# Patient Record
Sex: Male | Born: 1985 | Race: Black or African American | Hispanic: No | Marital: Single | Smoking: Never smoker
Health system: Southern US, Community
[De-identification: ages and names within clinical notes are randomized; demographics above are authoritative.]

## PROBLEM LIST (undated history)

## (undated) DIAGNOSIS — F909 Attention-deficit hyperactivity disorder, unspecified type: Secondary | ICD-10-CM

## (undated) DIAGNOSIS — F419 Anxiety disorder, unspecified: Secondary | ICD-10-CM

## (undated) DIAGNOSIS — I10 Essential (primary) hypertension: Secondary | ICD-10-CM

## (undated) DIAGNOSIS — F32A Depression, unspecified: Secondary | ICD-10-CM

## (undated) HISTORY — DX: Depression, unspecified: F32.A

## (undated) HISTORY — PX: TOOTH EXTRACTION: SUR596

## (undated) HISTORY — DX: Anxiety disorder, unspecified: F41.9

## (undated) HISTORY — DX: Essential (primary) hypertension: I10

## (undated) HISTORY — DX: Attention-deficit hyperactivity disorder, unspecified type: F90.9

---

## 2005-07-01 ENCOUNTER — Emergency Department: Payer: Self-pay | Admitting: Emergency Medicine

## 2005-08-11 ENCOUNTER — Emergency Department: Payer: Self-pay | Admitting: Unknown Physician Specialty

## 2006-03-12 ENCOUNTER — Emergency Department: Payer: Self-pay | Admitting: Emergency Medicine

## 2007-03-05 ENCOUNTER — Emergency Department: Payer: Self-pay | Admitting: Emergency Medicine

## 2007-06-30 ENCOUNTER — Emergency Department: Payer: Self-pay | Admitting: Emergency Medicine

## 2008-04-28 ENCOUNTER — Emergency Department: Payer: Self-pay | Admitting: Emergency Medicine

## 2008-07-06 ENCOUNTER — Emergency Department: Payer: Self-pay | Admitting: Emergency Medicine

## 2008-07-06 ENCOUNTER — Other Ambulatory Visit: Payer: Self-pay

## 2010-09-02 ENCOUNTER — Emergency Department: Payer: Self-pay | Admitting: Emergency Medicine

## 2010-09-03 ENCOUNTER — Emergency Department: Payer: Self-pay | Admitting: Emergency Medicine

## 2014-11-05 ENCOUNTER — Emergency Department: Payer: Self-pay | Admitting: Emergency Medicine

## 2015-05-22 ENCOUNTER — Encounter: Payer: Self-pay | Admitting: Emergency Medicine

## 2015-05-22 ENCOUNTER — Emergency Department
Admission: EM | Admit: 2015-05-22 | Discharge: 2015-05-22 | Disposition: A | Discharge status: Home / Self Care | Payer: Self-pay | Attending: Emergency Medicine | Admitting: Emergency Medicine

## 2015-05-22 DIAGNOSIS — K529 Noninfective gastroenteritis and colitis, unspecified: Secondary | ICD-10-CM

## 2015-05-22 LAB — CBC
HCT: 47.1 % (ref 40.0–52.0)
Hemoglobin: 15.2 g/dL (ref 13.0–18.0)
MCH: 26.4 pg (ref 26.0–34.0)
MCHC: 32.2 g/dL (ref 32.0–36.0)
MCV: 81.9 fL (ref 80.0–100.0)
PLATELETS: 275 10*3/uL (ref 150–440)
RBC: 5.75 MIL/uL (ref 4.40–5.90)
RDW: 14.8 % — ABNORMAL HIGH (ref 11.5–14.5)
WBC: 13.4 10*3/uL — ABNORMAL HIGH (ref 3.8–10.6)

## 2015-05-22 LAB — COMPREHENSIVE METABOLIC PANEL
ALBUMIN: 4.7 g/dL (ref 3.5–5.0)
ALT: 41 U/L (ref 17–63)
ANION GAP: 10 (ref 5–15)
AST: 36 U/L (ref 15–41)
Alkaline Phosphatase: 59 U/L (ref 38–126)
BUN: 13 mg/dL (ref 6–20)
CO2: 26 mmol/L (ref 22–32)
Calcium: 9.4 mg/dL (ref 8.9–10.3)
Chloride: 101 mmol/L (ref 101–111)
Creatinine, Ser: 0.83 mg/dL (ref 0.61–1.24)
GFR calc Af Amer: >60 mL/min (ref >60)
GFR calc non Af Amer: >60 mL/min (ref >60)
GLUCOSE: 125 mg/dL — AB (ref 65–99)
Potassium: 4.5 mmol/L (ref 3.5–5.1)
Sodium: 137 mmol/L (ref 135–145)
TOTAL PROTEIN: 8.7 g/dL — AB (ref 6.5–8.1)
Total Bilirubin: 1 mg/dL (ref 0.3–1.2)

## 2015-05-22 MED ORDER — ONDANSETRON 4 MG PO TBDP
4.0000 mg | ORAL_TABLET | Freq: Once | ORAL | Status: AC
  Administered 2015-05-22: 4 mg via ORAL
  Filled 2015-05-22: qty 1

## 2015-05-22 MED ORDER — ONDANSETRON 4 MG PO TBDP: 4.0000 mg | ORAL_TABLET | Freq: Three times a day (TID) | ORAL | Status: DC | PRN

## 2015-05-22 MED ORDER — ONDANSETRON HCL 4 MG/2ML IJ SOLN
INTRAMUSCULAR | Status: AC
  Filled 2015-05-22: qty 2

## 2015-05-22 MED ORDER — ONDANSETRON HCL 4 MG/2ML IJ SOLN
4.0000 mg | Freq: Once | INTRAMUSCULAR | Status: AC | PRN
  Administered 2015-05-22: 4 mg via INTRAVENOUS

## 2015-05-22 MED ORDER — ONDANSETRON HCL 4 MG/2ML IJ SOLN: 4.0000 mg | Freq: Once | INTRAMUSCULAR | Status: DC

## 2015-05-22 MED ORDER — SODIUM CHLORIDE 0.9 % IV BOLUS (SEPSIS)
1000.0000 mL | Freq: Once | INTRAVENOUS | Status: AC
  Administered 2015-05-22: 1000 mL via INTRAVENOUS

## 2015-05-22 MED ORDER — LOPERAMIDE HCL 2 MG PO CAPS
4.0000 mg | ORAL_CAPSULE | Freq: Once | ORAL | Status: AC
  Administered 2015-05-22: 4 mg via ORAL
  Filled 2015-05-22: qty 2

## 2015-05-22 NOTE — ED Notes (Signed)
Pt sitting up old side of stretcher in exam room with no distress noted; pt reports N/V/D since eating at [facility] last night; currently pt st feeling much better after IVFs received; reports only back pain at present; +BS, abd soft/nondist/nontender; taking sips of sprite at request (MD aware)

## 2015-05-22 NOTE — ED Notes (Signed)
Pt presents to ED with frequent vomiting, headache, and diarrhea X2 since last night after eating at [facility]. Pt states that even the spit in his mouth makes him vomit. Denies abd pain. Pt states he feels completely dehydrated. Pt currently alert and able to answer questions without diffculty. No increased work of breathing or acute distress noted at this time.

## 2015-05-22 NOTE — ED Provider Notes (Signed)
[facility] Emergency Department Provider Note  ____________________________________________  Time seen: 6:30 AM  I have reviewed the triage vital signs and the nursing notes.   HISTORY  Chief Complaint Emesis and Headache      HPI Ryan Morrow is a 29 y.o. male presents with acute onset of vomiting and diarrhea 5:30 AM this morning. Patient admits to multiple episodes of nonbloody vomiting and diarrhea. Patient states he feels very dehydrated. Of note patient admits to sick contact with the same (53-year-old daughter). Patient's last meal was when these last night. Patient denies any abdominal pain     Past medical history None There are no active problems to display for this patient.   Past surgical history None No current outpatient prescriptions on file.  Allergies Review of patient's allergies indicates no known allergies.  No family history on file.  Social History History  Substance Use Topics  . Smoking status: Never Smoker   . Smokeless tobacco: Never Used  . Alcohol Use: No    Review of Systems  Constitutional: Negative for fever. Eyes: Negative for visual changes. ENT: Negative for sore throat. Cardiovascular: Negative for chest pain. Respiratory: Negative for shortness of breath. Gastrointestinal: Positive for vomiting and diarrhea Genitourinary: Negative for dysuria. Musculoskeletal: Negative for back pain. Skin: Negative for rash. Neurological: Negative for headaches, focal weakness or numbness.   10-point ROS otherwise negative.  ____________________________________________   PHYSICAL EXAM:  VITAL SIGNS: ED Triage Vitals  Enc Vitals Group     BP 05/22/15 0240 146/98 mmHg     Pulse Rate 05/22/15 0240 86     Resp 05/22/15 0240 18     Temp 05/22/15 0240 99.1 F (37.3 C)     Temp Source 05/22/15 0240 Oral     SpO2 05/22/15 0240 96 %     Weight 05/22/15 0240 260 lb (117.935 kg)     Height 05/22/15 0240 6'  (1.829 m)     Head Cir --      Peak Flow --      Pain Score 05/22/15 0241 5     Pain Loc --      Pain Edu? --      Excl. in GC? --      Constitutional: Alert and oriented. Well appearing and in no distress. Eyes: Conjunctivae are normal. PERRL. Normal extraocular movements. ENT   Head: Normocephalic and atraumatic.   Nose: No congestion/rhinnorhea.   Mouth/Throat: Mucous membranes are moist.   Neck: No stridor. Hematological/Lymphatic/Immunilogical: No cervical lymphadenopathy. Cardiovascular: Normal rate, regular rhythm. Normal and symmetric distal pulses are present in all extremities. No murmurs, rubs, or gallops. Respiratory: Normal respiratory effort without tachypnea nor retractions. Breath sounds are clear and equal bilaterally. No wheezes/rales/rhonchi. Gastrointestinal: Soft and nontender. No distention. There is no CVA tenderness. Genitourinary: deferred Musculoskeletal: Nontender with normal range of motion in all extremities. No joint effusions.  No lower extremity tenderness nor edema. Neurologic:  Normal speech and language. No gross focal neurologic deficits are appreciated. Speech is normal.  Skin:  Skin is warm, dry and intact. No rash noted. Psychiatric: Mood and affect are normal. Speech and behavior are normal. Patient exhibits appropriate insight and judgment.  ____________________________________________    LABS (pertinent positives/negatives)  Labs Reviewed  COMPREHENSIVE METABOLIC PANEL - Abnormal; Notable for the following:    Glucose, Bld 125 (*)    Total Protein 8.7 (*)    All other components within normal limits  CBC - Abnormal; Notable for the following:  WBC 13.4 (*)    RDW 14.8 (*)    All other components within normal limits        INITIAL IMPRESSION / ASSESSMENT AND PLAN / ED COURSE  Pertinent labs & imaging results that were available during my care of the patient were reviewed by me and considered in my medical  decision making (see chart for details).  Given absence of abdominal pain on exam, history and physical exam consistent with acute gastroenteritis. Patient received Zofran and Imodium 4 mg each respectively area IV normal saline 1 L.  ____________________________________________   FINAL CLINICAL IMPRESSION(S) / ED DIAGNOSES  Final diagnoses:  Acute gastroenteritis      Darci Current, MD 05/23/15 [phone]

## 2015-05-22 NOTE — Discharge Instructions (Signed)

## 2015-11-05 ENCOUNTER — Emergency Department: Payer: Self-pay

## 2015-11-05 ENCOUNTER — Encounter: Payer: Self-pay | Admitting: Emergency Medicine

## 2015-11-05 ENCOUNTER — Emergency Department
Admission: EM | Admit: 2015-11-05 | Discharge: 2015-11-05 | Disposition: A | Discharge status: Home / Self Care | Payer: Self-pay | Attending: Emergency Medicine | Admitting: Emergency Medicine

## 2015-11-05 DIAGNOSIS — Y9389 Activity, other specified: Secondary | ICD-10-CM | POA: Insufficient documentation

## 2015-11-05 DIAGNOSIS — S39012A Strain of muscle, fascia and tendon of lower back, initial encounter: Secondary | ICD-10-CM | POA: Insufficient documentation

## 2015-11-05 DIAGNOSIS — Y92410 Unspecified street and highway as the place of occurrence of the external cause: Secondary | ICD-10-CM | POA: Insufficient documentation

## 2015-11-05 DIAGNOSIS — Y998 Other external cause status: Secondary | ICD-10-CM | POA: Insufficient documentation

## 2015-11-05 MED ORDER — NAPROXEN 500 MG PO TABS: 500.0000 mg | ORAL_TABLET | Freq: Two times a day (BID) | ORAL | Status: DC

## 2015-11-05 MED ORDER — CYCLOBENZAPRINE HCL 10 MG PO TABS
10.0000 mg | ORAL_TABLET | Freq: Every day | ORAL | Status: DC
Start: 2015-11-05 — End: 2016-09-27
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
## 2016-09-27 ENCOUNTER — Emergency Department
Admission: EM | Admit: 2016-09-27 | Discharge: 2016-09-27 | Disposition: A | Discharge status: Home / Self Care | Payer: Self-pay | Attending: Emergency Medicine | Admitting: Emergency Medicine

## 2016-09-27 DIAGNOSIS — R51 Headache: Secondary | ICD-10-CM | POA: Insufficient documentation

## 2016-09-27 DIAGNOSIS — R112 Nausea with vomiting, unspecified: Secondary | ICD-10-CM

## 2016-09-27 DIAGNOSIS — R197 Diarrhea, unspecified: Secondary | ICD-10-CM | POA: Insufficient documentation

## 2016-09-27 DIAGNOSIS — R1013 Epigastric pain: Secondary | ICD-10-CM | POA: Insufficient documentation

## 2016-09-27 LAB — COMPREHENSIVE METABOLIC PANEL
ALT: 29 U/L (ref 17–63)
AST: 22 U/L (ref 15–41)
Albumin: 4.5 g/dL (ref 3.5–5.0)
Alkaline Phosphatase: 59 U/L (ref 38–126)
Anion gap: 8 (ref 5–15)
BUN: 17 mg/dL (ref 6–20)
CHLORIDE: 101 mmol/L (ref 101–111)
CO2: 28 mmol/L (ref 22–32)
CREATININE: 1.51 mg/dL — AB (ref 0.61–1.24)
Calcium: 9.6 mg/dL (ref 8.9–10.3)
GFR calc Af Amer: >60 mL/min (ref >60)
Glucose, Bld: 99 mg/dL (ref 65–99)
Potassium: 3.7 mmol/L (ref 3.5–5.1)
Sodium: 137 mmol/L (ref 135–145)
Total Bilirubin: 1 mg/dL (ref 0.3–1.2)
Total Protein: 8.5 g/dL — ABNORMAL HIGH (ref 6.5–8.1)

## 2016-09-27 LAB — CBC WITH DIFFERENTIAL/PLATELET
Basophils Absolute: 0 10*3/uL (ref 0–0.1)
Basophils Relative: 0 %
EOS ABS: 0.2 10*3/uL (ref 0–0.7)
Eosinophils Relative: 2 %
HCT: 50 % (ref 40.0–52.0)
Hemoglobin: 16.8 g/dL (ref 13.0–18.0)
Lymphocytes Relative: 14 %
Lymphs Abs: 1.1 10*3/uL (ref 1.0–3.6)
MCH: 27.4 pg (ref 26.0–34.0)
MCHC: 33.5 g/dL (ref 32.0–36.0)
MCV: 81.6 fL (ref 80.0–100.0)
MONO ABS: 0.8 10*3/uL (ref 0.2–1.0)
Monocytes Relative: 10 %
Neutro Abs: 5.9 10*3/uL (ref 1.4–6.5)
Neutrophils Relative %: 74 %
Platelets: 261 10*3/uL (ref 150–440)
RBC: 6.13 MIL/uL — ABNORMAL HIGH (ref 4.40–5.90)
RDW: 14.7 % — ABNORMAL HIGH (ref 11.5–14.5)
WBC: 8 10*3/uL (ref 3.8–10.6)

## 2016-09-27 LAB — LIPASE, BLOOD: LIPASE: 17 U/L (ref 11–51)

## 2016-09-27 MED ORDER — ONDANSETRON HCL 4 MG PO TABS: 4.0000 mg | ORAL_TABLET | Freq: Three times a day (TID) | ORAL | 1 refills | Status: DC | PRN

## 2016-09-27 MED ORDER — SODIUM CHLORIDE 0.9 % IV BOLUS (SEPSIS)
1000.0000 mL | Freq: Once | INTRAVENOUS | Status: AC
  Administered 2016-09-27: 1000 mL via INTRAVENOUS

## 2016-09-27 MED ORDER — IBUPROFEN 600 MG PO TABS
600.0000 mg | ORAL_TABLET | Freq: Once | ORAL | Status: AC
  Administered 2016-09-27: 600 mg via ORAL
  Filled 2016-09-27: qty 1

## 2016-09-27 MED ORDER — ONDANSETRON HCL 4 MG/2ML IJ SOLN
4.0000 mg | Freq: Once | INTRAMUSCULAR | Status: AC
  Administered 2016-09-27: 4 mg via INTRAVENOUS
  Filled 2016-09-27: qty 2

## 2016-09-27 NOTE — ED Provider Notes (Signed)
[facility] Emergency Department Provider Note ____________________________________________   I have reviewed the triage vital signs and the triage nursing note.  HISTORY  Chief Complaint Abdominal Pain   Historian Patient  HPI Ryan Morrow is a 30 y.o. male with no significant past medical history, presents with nausea vomiting and diarrhea since Friday evening. He states he had some body aches on Friday and then Saturday had vomiting and diarrhea, nonbloody nonbilious, no bloody stool, but multiple episodes of watery diarrhea. He's had some mild abdominal cramping. This morning he ate an orange, and although he has not thrown up he still feels like it is not digesting.  He feels that he might be dehydrated and states his tongue is very dry. Nodes are moderate.  No significant travel history or sick exposures.    History reviewed. No pertinent past medical history.  There are no active problems to display for this patient.   History reviewed. No pertinent surgical history.  Prior to Admission medications   Medication Sig Start Date End Date Taking? Authorizing Provider  ondansetron (ZOFRAN) 4 MG tablet Take 1 tablet (4 mg total) by mouth every 8 (eight) hours as needed for nausea or vomiting. 09/27/16   Governor Rooksebecca Travon Crochet, MD    No Known Allergies  No family history on file.  Social History Social History  Substance Use Topics  . Smoking status: Never Smoker  . Smokeless tobacco: Never Used  . Alcohol use No    Review of Systems  Constitutional: Negative for fever. Eyes: Negative for visual changes. ENT: Negative for sore throat. Cardiovascular: Negative for chest pain. Respiratory: Negative for shortness of breath. Gastrointestinal: As per history of present illness. Genitourinary: Negative for dysuria. Musculoskeletal: Negative for back pain. Skin: Negative for rash. Neurological: Developed moderate gradual onset global headache this  morning. 10 point Review of Systems otherwise negative ____________________________________________   PHYSICAL EXAM:  VITAL SIGNS: ED Triage Vitals [09/27/16 0738]  Enc Vitals Group     BP 128/76     Pulse Rate 83     Resp 17     Temp 97.4 F (36.3 C)     Temp Source Oral     SpO2 98 %     Weight 240 lb (108.9 kg)     Height 6' (1.829 m)     Head Circumference      Peak Flow      Pain Score 7     Pain Loc      Pain Edu?      Excl. in GC?      Constitutional: Alert and oriented. Well appearing and in no distress. HEENT   Head: Normocephalic and atraumatic.      Eyes: Conjunctivae are normal. PERRL. Normal extraocular movements.      Ears:         Nose: No congestion/rhinnorhea.   Mouth/Throat: Mucous membranes are mildly dry.   Neck: No stridor. Cardiovascular/Chest: Normal rate, regular rhythm.  No murmurs, rubs, or gallops. Respiratory: Normal respiratory effort without tachypnea nor retractions. Breath sounds are clear and equal bilaterally. No wheezes/rales/rhonchi. Gastrointestinal: Soft. No distention, no guarding, no rebound. Very mild discomfort in the epigastrium.  Genitourinary/rectal:Deferred Musculoskeletal: Nontender with normal range of motion in all extremities. No joint effusions.  No lower extremity tenderness.  No edema. Neurologic:  Normal speech and language. No gross or focal neurologic deficits are appreciated. Skin:  Skin is warm, dry and intact. No rash noted. Psychiatric: Mood and affect are normal. Speech  and behavior are normal. Patient exhibits appropriate insight and judgment.   ____________________________________________  LABS (pertinent positives/negatives)  Labs Reviewed  COMPREHENSIVE METABOLIC PANEL - Abnormal; Notable for the following:       Result Value   Creatinine, Ser 1.51 (*)    Total Protein 8.5 (*)    All other components within normal limits  CBC WITH DIFFERENTIAL/PLATELET - Abnormal; Notable for the  following:    RBC 6.13 (*)    RDW 14.7 (*)    All other components within normal limits  LIPASE, BLOOD    ____________________________________________    EKG I, Governor Rooksebecca Valmore Arabie, MD, the attending physician have personally viewed and interpreted all ECGs.  None ____________________________________________  RADIOLOGY All Xrays were viewed by me. Imaging interpreted by Radiologist.  None __________________________________________  PROCEDURES  Procedure(s) performed: None  Critical Care performed: None  ____________________________________________   ED COURSE / ASSESSMENT AND PLAN  Pertinent labs & imaging results that were available during my care of the patient were reviewed by me and considered in my medical decision making (see chart for details).   Ryan Morrow is overall well-appearing, without fever, or vital sign abnormalities, but he does look a little dehydrated on clinical physical exam with a history of fluid losses through vomiting and diarrhea over 24 hours.  No significant abdominal pain. He is having a mild to moderate headache right now which is global in nature and nonfocal without neurologic deficits on exam or by history. I am treating him with IV fluids, Zofran for the nausea and vomiting symptoms, as well as ibuprofen for the headache.  He's not had respiratory symptoms, I think fluids a little bit less likely. I do think clinically this sounds like a viral gastroenteritis.  Improved clinically, labs reassuring as well.  I discussed discharge home with follow up at PCP.    CONSULTATIONS:  None   Patient / Family / Caregiver informed of clinical course, medical decision-making process, and agree with plan.   I discussed return precautions, follow-up instructions, and discharge instructions with patient and/or family.   ___________________________________________   FINAL CLINICAL IMPRESSION(S) / ED DIAGNOSES   Final diagnoses:  Nausea  vomiting and diarrhea              Note: This dictation was prepared with Dragon dictation. Any transcriptional errors that result from this process are unintentional    Governor Rooksebecca Tenelle Andreason, MD 09/27/16 1229

## 2016-09-27 NOTE — ED Triage Notes (Signed)
Pt c/o generalized abd pain with N/V/D since Friday.  

## 2016-09-27 NOTE — Discharge Instructions (Signed)
From Dr. Shaune PollackLord: You were evaluated after nausea vomiting and diarrhea since Friday, and were given IV fluids here in the emergency room. Although no certain cause was found, I suspect a virus, and your exam and evaluation in the emergency department are reassuring today.  You may try over-the-counter Imodium for diarrhea. You're being prescribed Zofran for nausea.  Return to the emergency room for any worsening symptoms including concern for dehydration such as dry mouth and not making urine, fever, no worsening abdominal pain, trouble breathing, confusion or altered mental status, dizziness or passing out.

## 2017-08-29 ENCOUNTER — Emergency Department: Payer: Self-pay

## 2017-08-29 ENCOUNTER — Emergency Department
Admission: EM | Admit: 2017-08-29 | Discharge: 2017-08-29 | Discharge status: Against Medical Advice | Payer: Self-pay | Attending: Emergency Medicine | Admitting: Emergency Medicine

## 2017-08-29 ENCOUNTER — Other Ambulatory Visit: Payer: Self-pay

## 2017-08-29 DIAGNOSIS — R55 Syncope and collapse: Secondary | ICD-10-CM | POA: Insufficient documentation

## 2017-08-29 DIAGNOSIS — Z5329 Procedure and treatment not carried out because of patient's decision for other reasons: Secondary | ICD-10-CM | POA: Insufficient documentation

## 2017-08-29 DIAGNOSIS — F410 Panic disorder [episodic paroxysmal anxiety] without agoraphobia: Secondary | ICD-10-CM | POA: Insufficient documentation

## 2017-08-29 DIAGNOSIS — R079 Chest pain, unspecified: Secondary | ICD-10-CM | POA: Insufficient documentation

## 2017-08-29 LAB — CBC
HCT: 44.9 % (ref 40.0–52.0)
Hemoglobin: 14.6 g/dL (ref 13.0–18.0)
MCH: 27.2 pg (ref 26.0–34.0)
MCHC: 32.5 g/dL (ref 32.0–36.0)
MCV: 83.7 fL (ref 80.0–100.0)
Platelets: 255 10*3/uL (ref 150–440)
RBC: 5.37 MIL/uL (ref 4.40–5.90)
RDW: 14 % (ref 11.5–14.5)
WBC: 8.1 10*3/uL (ref 3.8–10.6)

## 2017-08-29 LAB — BASIC METABOLIC PANEL
Anion gap: 11 (ref 5–15)
BUN: 14 mg/dL (ref 6–20)
CO2: 24 mmol/L (ref 22–32)
Calcium: 9.6 mg/dL (ref 8.9–10.3)
Chloride: 102 mmol/L (ref 101–111)
Creatinine, Ser: 0.89 mg/dL (ref 0.61–1.24)
GFR calc Af Amer: >60 mL/min (ref >60)
GFR calc non Af Amer: >60 mL/min (ref >60)
Glucose, Bld: 102 mg/dL — ABNORMAL HIGH (ref 65–99)
Potassium: 3.5 mmol/L (ref 3.5–5.1)
Sodium: 137 mmol/L (ref 135–145)

## 2017-08-29 LAB — TROPONIN I

## 2017-08-29 MED ORDER — ASPIRIN 81 MG PO CHEW: 324.0000 mg | CHEWABLE_TABLET | Freq: Once | ORAL | Status: DC

## 2017-08-29 NOTE — ED Provider Notes (Signed)
[facility] Emergency Department Provider Note  ____________________________________________  Time seen: Approximately 4:56 PM  I have reviewed the triage vital signs and the nursing notes.   HISTORY  Chief Complaint Chest Pain   HPI Roselind RilyJoshua Hopes is a 31 y.o. male no significant past medical history who presents for evaluation of an episode of chest pain. Patient reports that he was at work when he received a very upsetting text message. He went in the bathroom to wash his face and started hyperventilating. He started to have cramping on both his arms and L sided chest pain that he describes as a sharp cramp. He also noticed tingling and numbness of bilateral hands, fingers and the L side of his face. He had a syncopal episode but did not sustain any injuries.When he regained consciousness he was being lift up by his coworkers. No seizure-like activity, no tongue trauma, no urinary or bowel loss. Patient denies history of anxiety or ever passing out in the past. He is feeling back to his baseline with no further chest pain or cramping. He has family history of ischemic heart disease in his father and grandfather. He is not a smoker. Patient denies any other medical problems.  No past medical history on file.  There are no active problems to display for this patient.   No past surgical history on file.  Prior to Admission medications   Medication Sig Start Date End Date Taking? Authorizing Provider  ondansetron (ZOFRAN) 4 MG tablet Take 1 tablet (4 mg total) by mouth every 8 (eight) hours as needed for nausea or vomiting. 09/27/16   Governor RooksLord, Rebecca, MD    Allergies Patient has no known allergies.  No family history on file.  Social History Social History   Tobacco Use  . Smoking status: Never Smoker  . Smokeless tobacco: Never Used  Substance Use Topics  . Alcohol use: No  . Drug use: No    Review of Systems  Constitutional: Negative for  fever. + syncope Eyes: Negative for visual changes. ENT: Negative for sore throat. Neck: No neck pain  Cardiovascular: + chest pain. Respiratory: Negative for shortness of breath.  Gastrointestinal: Negative for abdominal pain, vomiting or diarrhea. Genitourinary: Negative for dysuria. Musculoskeletal: Negative for back pain. Skin: Negative for rash. Neurological: Negative for headaches, weakness or numbness. Psych: No SI or HI. + hyperventilation  ____________________________________________   PHYSICAL EXAM:  VITAL SIGNS: ED Triage Vitals  Enc Vitals Group     BP 08/29/17 1556 (!) 145/70     Pulse Rate 08/29/17 1556 84     Resp 08/29/17 1556 18     Temp 08/29/17 1556 98 F (36.7 C)     Temp Source 08/29/17 1556 Oral     SpO2 08/29/17 1556 98 %     Weight 08/29/17 1558 228 lb (103.4 kg)     Height 08/29/17 1558 6' (1.829 m)     Head Circumference --      Peak Flow --      Pain Score 08/29/17 1556 6     Pain Loc --      Pain Edu? --      Excl. in GC? --     Constitutional: Alert and oriented. Well appearing and in no apparent distress. HEENT:      Head: Normocephalic and atraumatic.         Eyes: Conjunctivae are normal. Sclera is non-icteric.       Mouth/Throat: Mucous membranes are moist.  Neck: Supple with no signs of meningismus. Cardiovascular: Regular rate and rhythm. No murmurs, gallops, or rubs. 2+ symmetrical distal pulses are present in all extremities. No JVD. Respiratory: Normal respiratory effort. Lungs are clear to auscultation bilaterally. No wheezes, crackles, or rhonchi.  Gastrointestinal: Soft, non tender, and non distended with positive bowel sounds. No rebound or guarding. Genitourinary: No CVA tenderness. Musculoskeletal: Nontender with normal range of motion in all extremities. No edema, cyanosis, or erythema of extremities. Neurologic: Normal speech and language. A & O x3, PERRL, no nystagmus, CN II-XII intact, motor testing reveals good  tone and bulk throughout. There is no evidence of pronator drift or dysmetria. Muscle strength is 5/5 throughout. Sensory examination is intact. Gait is normal. Skin: Skin is warm, dry and intact. No rash noted. Psychiatric: Mood and affect are normal. Speech and behavior are normal.  ____________________________________________   LABS (all labs ordered are listed, but only abnormal results are displayed)  Labs Reviewed  BASIC METABOLIC PANEL - Abnormal; Notable for the following components:      Result Value   Glucose, Bld 102 (*)    All other components within normal limits  CBC  TROPONIN I  TROPONIN I   ____________________________________________  EKG  ED ECG REPORT I, Nita Sickle, the attending physician, personally viewed and interpreted this ECG.   Normal sinus rhythm, normal intervals, normal axis, no STE or depressions, no evidence of HOCM, AV block, delta wave, ARVD, prolonged QTc, WPW, or Brugada.   ____________________________________________  RADIOLOGY  CXR:  Negative ____________________________________________   PROCEDURES  Procedure(s) performed: None Procedures Critical Care performed:  None ____________________________________________   INITIAL IMPRESSION / ASSESSMENT AND PLAN / ED COURSE   31 y.o. male no significant past medical history who presents for evaluation of an episode of chest pain, syncope in the setting of receiving a very upsetting text message which caused him to hyperventilate. Symptoms most likely for hyperventilation. EKG with no ischemic changes or abnormal dysrhythmias. First troponin is negative. Patient feels back to his baseline and is asymptomatic at this time. Due to his family history of cardiac disease will get a second troponin 3 hours from when patient was symptom-free. In the meantime we'll monitor him on telemetry and given him an aspirin.  Clinical Course as of Aug 30 1707  Wynelle Link Aug 29, 2017  1705 I told patient  that we would monitor him for 3 hours on telemetry and repeat troponin to rule out his symptoms being due to a heart attack and patient was in agreement. After I left the room, patient was seen by registration staff eloping. I was very clear when I spoke with the patient that I thought his symptoms were due to anxiety and hyperventilation but due to his family history of heart attack he needed serial cardiac enzymes to rule out a heart attack.  [CV]    Clinical Course User Index [CV] Don Perking Washington, MD     As part of my medical decision making, I reviewed the following data within the electronic MEDICAL RECORD NUMBER Nursing notes reviewed and incorporated, Labs reviewed , EKG interpreted , Radiograph reviewed , Notes from prior ED visits and [place] Controlled Substance Database    Pertinent labs & imaging results that were available during my care of the patient were reviewed by me and considered in my medical decision making (see chart for details).    ____________________________________________   FINAL CLINICAL IMPRESSION(S) / ED DIAGNOSES  Final diagnoses:  Chest pain, unspecified type  Syncope, unspecified syncope type  Anxiety attack      NEW MEDICATIONS STARTED DURING THIS VISIT:  This SmartLink is deprecated. Use AVSMEDLIST instead to display the medication list for a patient.   Note:  This document was prepared using Dragon voice recognition software and may include unintentional dictation errors.    Nita SickleVeronese, [place], MD 08/29/17 [phone]

## 2017-08-29 NOTE — ED Triage Notes (Signed)
Pt reports that he was doing physical work and received a disturbing text, began breathing fast, states his hands and face started tingling and passed out, pt states that he cont to have some discomfort in the left arm radiating to the left side of his chest, pt reports that sensation has not returned to his face, pt reassured a this time, no distress noted

## 2017-08-29 NOTE — ED Notes (Signed)
Pt states he was at work today and thinks he had a panic attack in the bathroom. Pt states he blacked out in the bathroom and when he came to, his left arm was numb and he couldn't catch his breath.  Pt has no hx of anxiety or panic attacks. Pt states he does have a lot of stress at home. Pt denies any SI/HI.

## 2017-08-29 NOTE — ED Notes (Signed)
Pt not in room, registration witnessed patient walk down the hall with visitor.  Pt eloped. Pt already seen by EDP.

## 2018-05-08 ENCOUNTER — Emergency Department: Payer: Self-pay

## 2018-05-08 ENCOUNTER — Other Ambulatory Visit: Payer: Self-pay

## 2018-05-08 ENCOUNTER — Emergency Department
Admission: EM | Admit: 2018-05-08 | Discharge: 2018-05-08 | Disposition: A | Discharge status: Home / Self Care | Payer: Self-pay | Attending: Emergency Medicine | Admitting: Emergency Medicine

## 2018-05-08 DIAGNOSIS — R413 Other amnesia: Secondary | ICD-10-CM | POA: Insufficient documentation

## 2018-05-08 DIAGNOSIS — F419 Anxiety disorder, unspecified: Secondary | ICD-10-CM | POA: Insufficient documentation

## 2018-05-08 DIAGNOSIS — F329 Major depressive disorder, single episode, unspecified: Secondary | ICD-10-CM | POA: Insufficient documentation

## 2018-05-08 DIAGNOSIS — R454 Irritability and anger: Secondary | ICD-10-CM | POA: Insufficient documentation

## 2018-05-08 LAB — URINE DRUG SCREEN, QUALITATIVE (ARMC ONLY)
Amphetamines, Ur Screen: NOT DETECTED
Benzodiazepine, Ur Scrn: NOT DETECTED
COCAINE METABOLITE, UR ~~LOC~~: NOT DETECTED
Cannabinoid 50 Ng, Ur ~~LOC~~: NOT DETECTED
MDMA (ECSTASY) UR SCREEN: NOT DETECTED
Methadone Scn, Ur: NOT DETECTED
OPIATE, UR SCREEN: NOT DETECTED
Phencyclidine (PCP) Ur S: NOT DETECTED
Tricyclic, Ur Screen: NOT DETECTED

## 2018-05-08 LAB — COMPREHENSIVE METABOLIC PANEL
ALBUMIN: 4.5 g/dL (ref 3.5–5.0)
ALK PHOS: 53 U/L (ref 38–126)
ALT: 31 U/L (ref 0–44)
AST: 31 U/L (ref 15–41)
Anion gap: 5 (ref 5–15)
BUN: 13 mg/dL (ref 6–20)
CALCIUM: 9.2 mg/dL (ref 8.9–10.3)
CO2: 28 mmol/L (ref 22–32)
Chloride: 106 mmol/L (ref 98–111)
Creatinine, Ser: 0.83 mg/dL (ref 0.61–1.24)
GFR calc Af Amer: >60 mL/min (ref >60)
GFR calc non Af Amer: >60 mL/min (ref >60)
GLUCOSE: 100 mg/dL — AB (ref 70–99)
Potassium: 3.8 mmol/L (ref 3.5–5.1)
Sodium: 139 mmol/L (ref 135–145)
Total Bilirubin: 0.7 mg/dL (ref 0.3–1.2)
Total Protein: 8 g/dL (ref 6.5–8.1)

## 2018-05-08 LAB — CBC WITH DIFFERENTIAL/PLATELET
Basophils Absolute: 0.1 10*3/uL (ref 0–0.1)
Basophils Relative: 1 %
Eosinophils Absolute: 0.5 10*3/uL (ref 0–0.7)
Eosinophils Relative: 6 %
HEMATOCRIT: 45 % (ref 40.0–52.0)
HEMOGLOBIN: 15.1 g/dL (ref 13.0–18.0)
LYMPHS PCT: 22 %
Lymphs Abs: 1.7 10*3/uL (ref 1.0–3.6)
MCH: 28.3 pg (ref 26.0–34.0)
MCHC: 33.5 g/dL (ref 32.0–36.0)
MCV: 84.4 fL (ref 80.0–100.0)
MONO ABS: 0.5 10*3/uL (ref 0.2–1.0)
Monocytes Relative: 6 %
NEUTROS PCT: 65 %
Neutro Abs: 5 10*3/uL (ref 1.4–6.5)
Platelets: 251 10*3/uL (ref 150–440)
RBC: 5.33 MIL/uL (ref 4.40–5.90)
RDW: 14.5 % (ref 11.5–14.5)
WBC: 7.6 10*3/uL (ref 3.8–10.6)

## 2018-05-08 LAB — ETHANOL: Alcohol, Ethyl (B): <10 mg/dL (ref <10)

## 2018-05-08 MED ORDER — LORAZEPAM 1 MG PO TABS: 1.0000 mg | ORAL_TABLET | Freq: Three times a day (TID) | ORAL | 0 refills | Status: DC | PRN

## 2018-05-08 NOTE — ED Provider Notes (Signed)
[facility] Emergency Department Provider Note   ____________________________________________   First MD Initiated Contact with Patient 05/08/18 [phone]     (approximate)  I have reviewed the triage vital signs and the nursing notes.   HISTORY  Chief Complaint Memory loss   HPI Ryan Morrow is a 32 y.o. male who presents to the ED from work with a chief complaint of anger issues, panic attack and memory loss.  Patient states he has "always" had issues with his memory when he gets angry.  Tonight he had an argument with his coworker and wrote his recollection down on paper.  Once he calmed down, he was shocked to find that what he wrote was incoherent.  Then he describes that panic set in and he was unable to find his car because he was so anxious.  He was unable to remember the way to the ED because his mind was racing.  States he is more calm now and currently denies active SI/HI/AH/VH.  Denies striking head or LOC.  Denies recent fever, chills, headache, vision changes, neck pain, chest pain, shortness of breath, abdominal pain, nausea, vomiting.  Denies recent travel.  Does not see a therapist and is not on anxiolytics.   Past medical history None  There are no active problems to display for this patient.   No past surgical history on file.  Prior to Admission medications   Medication Sig Start Date End Date Taking? Authorizing Provider  ondansetron (ZOFRAN) 4 MG tablet Take 1 tablet (4 mg total) by mouth every 8 (eight) hours as needed for nausea or vomiting. 09/27/16   Governor Rooks, MD    Allergies Patient has no known allergies.  No family history on file.  Social History Social History   Tobacco Use  . Smoking status: Never Smoker  . Smokeless tobacco: Never Used  Substance Use Topics  . Alcohol use: No  . Drug use: No    Review of Systems  Constitutional: No fever/chills Eyes: No visual changes. ENT: No sore  throat. Cardiovascular: Denies chest pain. Respiratory: Denies shortness of breath. Gastrointestinal: No abdominal pain.  No nausea, no vomiting.  No diarrhea.  No constipation. Genitourinary: Negative for dysuria. Musculoskeletal: Negative for back pain. Skin: Negative for rash. Neurological: Positive for short-term memory loss.  Negative for headaches, focal weakness or numbness. Psychiatric:Positive for anger and anxiety.  ____________________________________________   PHYSICAL EXAM:  VITAL SIGNS: ED Triage Vitals [05/08/18 0038]  Enc Vitals Group     BP (!) 162/99     Pulse Rate 61     Resp 16     Temp 98.2 F (36.8 C)     Temp Source Oral     SpO2 99 %     Weight 230 lb (104.3 kg)     Height 6' (1.829 m)     Head Circumference      Peak Flow      Pain Score 0     Pain Loc      Pain Edu?      Excl. in GC?     Constitutional: Alert and oriented. Well appearing and in mild acute distress. Eyes: Conjunctivae are normal. PERRL. EOMI. Head: Atraumatic. Nose: No congestion/rhinnorhea. Mouth/Throat: Mucous membranes are moist.  Oropharynx non-erythematous. Neck: No stridor.  No cervical spine tenderness to palpation.  Supple neck without meningismus.  No carotid bruits. Cardiovascular: Normal rate, regular rhythm. Grossly normal heart sounds.  Good peripheral circulation. Respiratory: Normal respiratory effort.  No  retractions. Lungs CTAB. Gastrointestinal: Soft and nontender. No distention. No abdominal bruits. No CVA tenderness. Musculoskeletal: No lower extremity tenderness nor edema.  No joint effusions. Neurologic: Alert and oriented x3.  CN II-XII grossly intact.  Normal speech and language. No gross focal neurologic deficits are appreciated. No gait instability. Skin:  Skin is warm, dry and intact. No rash noted. Psychiatric: Mood and affect are flat and anxious. Speech and behavior are normal.  ____________________________________________   LABS (all labs  ordered are listed, but only abnormal results are displayed)  Labs Reviewed  COMPREHENSIVE METABOLIC PANEL - Abnormal; Notable for the following components:      Result Value   Glucose, Bld 100 (*)    All other components within normal limits  URINE DRUG SCREEN, QUALITATIVE ([facility] ONLY) - Abnormal; Notable for the following components:   Barbiturates, Ur Screen   (*)    Value: Result not available. Reagent lot number recalled by manufacturer.   All other components within normal limits  CBC WITH DIFFERENTIAL/PLATELET  ETHANOL   ____________________________________________  EKG  None ____________________________________________  RADIOLOGY  ED MD interpretation: No ICH; MRI unremarkable  Official radiology report(s): Ct Head Wo Contrast  Result Date: 05/08/2018 CLINICAL DATA:  Memory loss and panic attack at work x3 months. EXAM: CT HEAD WITHOUT CONTRAST TECHNIQUE: Contiguous axial images were obtained from the base of the skull through the vertex without intravenous contrast. COMPARISON:  None. FINDINGS: BRAIN: The ventricles and sulci are normal. No intraparenchymal hemorrhage, mass effect nor midline shift. No acute large vascular territory infarcts. Grey-white matter distinction is maintained. The basal ganglia are unremarkable. No abnormal extra-axial fluid collections. Basal cisterns are not effaced and midline. The brainstem and cerebellar hemispheres are without acute abnormalities. VASCULAR: Unremarkable. SKULL/SOFT TISSUES: No skull fracture. No significant soft tissue swelling. ORBITS/SINUSES: The included ocular globes and orbital contents are normal.The mastoid air cells are clear. The included paranasal sinuses are well-aerated. OTHER: None. IMPRESSION: Normal head CT Electronically Signed   By: Tollie Ethavid  Kwon M.D.   On: 05/08/2018 01:26   Mr Brain Wo Contrast  Result Date: 05/08/2018 CLINICAL DATA:  Initial evaluation for acute memory loss. EXAM: MRI HEAD WITHOUT CONTRAST  TECHNIQUE: Multiplanar, multiecho pulse sequences of the brain and surrounding structures were obtained without intravenous contrast. COMPARISON:  Prior CT from earlier the same day. FINDINGS: Brain: Cerebral volume within normal limits for patient age. No focal parenchymal signal abnormality identified. No abnormal foci of restricted diffusion to suggest acute or subacute ischemia. Gray-white matter differentiation well maintained. No encephalomalacia to suggest chronic infarction. No foci of susceptibility artifact to suggest acute or chronic intracranial hemorrhage. Mass lesion, midline shift or mass effect. No hydrocephalus. No extra-axial fluid collection. Major dural sinuses are grossly patent. Pituitary gland and suprasellar region are normal. Midline structures intact and normal. Vascular: Major intracranial vascular flow voids well maintained and normal in appearance. Skull and upper cervical spine: Craniocervical junction normal. Visualized upper cervical spine within normal limits. Bone marrow signal intensity normal. No scalp soft tissue abnormality. Sinuses/Orbits: Globes and orbital soft tissues within normal limits. Paranasal sinuses are clear. No mastoid effusion. Inner ear structures normal. Other: None. IMPRESSION: Normal brain MRI.  No acute intracranial abnormality identified. Electronically Signed   By: Rise MuBenjamin  McClintock M.D.   On: 05/08/2018 07:00    ____________________________________________   PROCEDURES  Procedure(s) performed: None  Procedures  Critical Care performed: No  ____________________________________________   INITIAL IMPRESSION / ASSESSMENT AND PLAN / ED COURSE  As part  of my medical decision making, I reviewed the following data within the electronic MEDICAL RECORD NUMBER Nursing notes reviewed and incorporated, Labs reviewed, Old chart reviewed, Radiograph reviewed, A consult was requested and obtained from this/these consultant(s) Psychiatry and Notes from  prior ED visits   32 year old male who presents with anger management issues, anxiety and short-term memory loss.  Differential diagnosis includes but is not limited to psychiatric illness, CVA, ICH, infectious, metabolic etiologies, etc.  Laboratory, UDS, CT head unremarkable.  Will obtain MRI brain to evaluate organic cause of patient's memory loss.  Will consult [facility] psychiatry and TTS to evaluate patient's anger and anxiety.   Clinical Course as of May 08 720  Sun May 08, 2018  0716 Updated patient on normal MRI result.  She was evaluated by [facility] psychiatrist Dr. Jonelle Sidle who recommends discharge home with outpatient neurology and psychiatry follow-ups.  Strict return precautions given.  Will discharge home with limited quantity oral Ativan for anxiety.  Patient verbalizes understanding and agrees with plan of care.   [JS]    Clinical Course User Index [JS] Irean Hong, MD     ____________________________________________   FINAL CLINICAL IMPRESSION(S) / ED DIAGNOSES  Final diagnoses:  Anger  Anxiety  Memory loss     ED Discharge Orders    None       Note:  This document was prepared using Dragon voice recognition software and may include unintentional dictation errors.    Irean Hong, MD 05/08/18 [phone]

## 2018-05-08 NOTE — ED Notes (Signed)
Pt reports HAs (7 to 8 over the last month) takes multiple rounds of excederin with

## 2018-05-08 NOTE — ED Notes (Signed)
TTS at bedside. 

## 2018-05-08 NOTE — BH Assessment (Signed)
Assessment Note  Ryan Morrow is an 32 y.o. male who presents to the ED via POV following an episode wherein he felt confused at work. Pt reports that he was in a verbal altercation with a coworker about 3 months ago and was asked by his supervisor to write a statement. He reports that he was upset at the time and did not want to write the statement but was instructed to do so anyway. He states that today he requested a copy of the statement and the words that he wrote doesn't match the scenario as he said it happened. He reports that he doesn't understand why he wrote what he wrote because the two accounts are polar opposite. He states that on his way to the hospital he became confused and lost his way four times even though he reports the hospital is "a straight shot down the highway from my job".    Pt reports he has had panic attacks in the past wherein he became confused but stated today was by far the worst. Pt reports he has a hx of cutting but no longer engages in that type of behavior. He reports feeling depressed all the time causing him to isolate himself and only want to be in the dark. Pt reports he has a family hx of schizophrenia (brother and two aunts). Pt reports he only sleeps about 3 hours per day.   Pt denies SI/HI A/V H/D.  Diagnosis: Depression  Past Medical History: No past medical history on file.  No past surgical history on file.  Family History: No family history on file.  Social History:  reports that he has never smoked. He has never used smokeless tobacco. He reports that he does not drink alcohol or use drugs.  Additional Social History:  Alcohol / Drug Use Pain Medications: SEE MAR Prescriptions: SEE MAR Over the Counter: SEE MAR History of alcohol / drug use?: No history of alcohol / drug abuse  CIWA: CIWA-Ar BP: (!) 162/99 Pulse Rate: 61 COWS:    Allergies: No Known Allergies  Home Medications:  (Not in a hospital admission)  OB/GYN Status:  No  LMP for male patient.  General Assessment Data Location of Assessment: [facility] ED TTS Assessment: In system Is this a Tele or Face-to-Face Assessment?: Face-to-Face Is this an Initial Assessment or a Re-assessment for this encounter?: Initial Assessment Marital status: Single Living Arrangements: Alone Can pt return to current living arrangement?: Yes Admission Status: Voluntary Is patient capable of signing voluntary admission?: Yes Referral Source: Self/Family/Friend Insurance type: None  Medical Screening Exam [facility] Walk-in ONLY) Medical Exam completed: Yes  Crisis Care Plan Living Arrangements: Alone Legal Guardian: Other:(Self) Name of Psychiatrist: n/a Name of Therapist: n/a  Education Status Is patient currently in school?: No Is the patient employed, unemployed or receiving disability?: Employed  Risk to self with the past 6 months Suicidal Ideation: No Has patient been a risk to self within the past 6 months prior to admission? : No Suicidal Intent: No Has patient had any suicidal intent within the past 6 months prior to admission? : No Is patient at risk for suicide?: No Suicidal Plan?: No Has patient had any suicidal plan within the past 6 months prior to admission? : No Access to Means: No What has been your use of drugs/alcohol within the last 12 months?: denies use Previous Attempts/Gestures: No How many times?: 0 Other Self Harm Risks: hx of cutting Triggers for Past Attempts: None known Intentional Self Injurious Behavior:  Cutting(hx of cutting) Family Suicide History: No Recent stressful life event(s): Recent negative physical changes(Memory loss) Persecutory voices/beliefs?: No Depression: Yes Depression Symptoms: Insomnia, Feeling worthless/self pity Substance abuse history and/or treatment for substance abuse?: No Suicide prevention information given to non-admitted patients: Not applicable  Risk to Others within the past 6 months Homicidal  Ideation: No Does patient have any lifetime risk of violence toward others beyond the six months prior to admission? : No Thoughts of Harm to Others: No Current Homicidal Intent: No Current Homicidal Plan: No Access to Homicidal Means: No Identified Victim: n/a History of harm to others?: No Assessment of Violence: None Noted Violent Behavior Description: denins Does patient have access to weapons?: No Criminal Charges Pending?: No Does patient have a court date: No Is patient on probation?: No  Psychosis Hallucinations: None noted Delusions: None noted  Mental Status Report Appearance/Hygiene: In scrubs, Unremarkable Eye Contact: Good Motor Activity: Freedom of movement, Agitation Speech: Logical/coherent Level of Consciousness: Alert Mood: Anxious Affect: Anxious, Appropriate to circumstance Anxiety Level: Moderate Thought Processes: Coherent, Relevant Judgement: Partial Orientation: Person, Place, Time, Situation, Appropriate for developmental age Obsessive Compulsive Thoughts/Behaviors: Minimal  Cognitive Functioning Concentration: Decreased Memory: Recent Impaired, Remote Impaired Is patient IDD: No Is patient DD?: No Insight: Fair Impulse Control: Fair Appetite: Fair Have you had any weight changes? : No Change Sleep: Decreased Total Hours of Sleep: 3 Vegetative Symptoms: None  ADLScreening [facility] Assessment Services) Patient's cognitive ability adequate to safely complete daily activities?: Yes Patient able to express need for assistance with ADLs?: Yes Independently performs ADLs?: Yes (appropriate for developmental age)  Prior Inpatient Therapy Prior Inpatient Therapy: No  Prior Outpatient Therapy Prior Outpatient Therapy: No Does patient have an ACCT team?: No Does patient have Intensive In-House Services?  : No Does patient have [facility] services? : No Does patient have P4CC services?: No  ADL Screening (condition at time of admission) Patient's  cognitive ability adequate to safely complete daily activities?: Yes Is the patient deaf or have difficulty hearing?: No Does the patient have difficulty seeing, even when wearing glasses/contacts?: No Does the patient have difficulty concentrating, remembering, or making decisions?: No Patient able to express need for assistance with ADLs?: Yes Does the patient have difficulty dressing or bathing?: No Independently performs ADLs?: Yes (appropriate for developmental age) Does the patient have difficulty walking or climbing stairs?: No Weakness of Legs: None Weakness of Arms/Hands: None  Home Assistive Devices/Equipment Home Assistive Devices/Equipment: None  Therapy Consults (therapy consults require a physician order) PT Evaluation Needed: No OT Evalulation Needed: No SLP Evaluation Needed: No Abuse/Neglect Assessment (Assessment to be complete while patient is alone) Abuse/Neglect Assessment Can Be Completed: Yes Physical Abuse: Denies Verbal Abuse: Denies Sexual Abuse: Denies Exploitation of patient/patient's resources: Denies Self-Neglect: Denies Values / Beliefs Cultural Requests During Hospitalization: None Spiritual Requests During Hospitalization: None Consults Spiritual Care Consult Needed: No Social Work Consult Needed: No Merchant navy officerAdvance Directives (For Healthcare) Does Patient Have a Medical Advance Directive?: No    Additional Information 1:1 In Past 12 Months?: No CIRT Risk: No Elopement Risk: No Does patient have medical clearance?: Yes  Child/Adolescent Assessment Running Away Risk: Denies(Pt is an adult)  Disposition:  Disposition Initial Assessment Completed for this Encounter: Yes Disposition of Patient: (Pending SOC) Patient refused recommended treatment: No Mode of transportation if patient is discharged?: Car  On Site Evaluation by:   Reviewed with Physician:    Dalaina Tates D Faelyn Sigler 05/08/2018 5:39 AM

## 2018-05-08 NOTE — ED Notes (Signed)
Pt moved to interview room for telepsych consult

## 2018-05-08 NOTE — Discharge Instructions (Addendum)
1.  You may take Ativan 1 mg every 8 hours as needed for anxiety. 2.  Return to the ER for worsening symptoms, persistent vomiting, difficulty breathing, lethargy or other concerns.

## 2018-05-08 NOTE — ED Notes (Signed)
telepsych SOC cart at bedside

## 2018-05-08 NOTE — ED Triage Notes (Signed)
Patient states I've been witting down things and they are different from what I remember.  I had a panic attack at work and on the way here I turned around and it a route I know. Reports panic attacks for approximately 3 months, noticed issues with memory around 10 pm.  Patient reports seems to affect short term memory and decision making.

## 2018-06-13 ENCOUNTER — Emergency Department (HOSPITAL_COMMUNITY)
Admission: EM | Admit: 2018-06-13 | Discharge: 2018-06-13 | Disposition: A | Discharge status: Home / Self Care | Payer: Self-pay | Attending: Emergency Medicine | Admitting: Emergency Medicine

## 2018-06-13 ENCOUNTER — Other Ambulatory Visit: Payer: Self-pay

## 2018-06-13 ENCOUNTER — Emergency Department (HOSPITAL_COMMUNITY): Payer: Self-pay

## 2018-06-13 ENCOUNTER — Encounter (HOSPITAL_COMMUNITY): Payer: Self-pay

## 2018-06-13 DIAGNOSIS — W230XXA Caught, crushed, jammed, or pinched between moving objects, initial encounter: Secondary | ICD-10-CM | POA: Insufficient documentation

## 2018-06-13 DIAGNOSIS — Y939 Activity, unspecified: Secondary | ICD-10-CM | POA: Insufficient documentation

## 2018-06-13 DIAGNOSIS — Z79899 Other long term (current) drug therapy: Secondary | ICD-10-CM | POA: Insufficient documentation

## 2018-06-13 DIAGNOSIS — Y929 Unspecified place or not applicable: Secondary | ICD-10-CM | POA: Insufficient documentation

## 2018-06-13 DIAGNOSIS — Y990 Civilian activity done for income or pay: Secondary | ICD-10-CM | POA: Insufficient documentation

## 2018-06-13 DIAGNOSIS — S63501A Unspecified sprain of right wrist, initial encounter: Secondary | ICD-10-CM | POA: Insufficient documentation

## 2018-06-13 MED ORDER — IBUPROFEN 600 MG PO TABS: 600.0000 mg | ORAL_TABLET | Freq: Four times a day (QID) | ORAL | 0 refills | Status: DC | PRN

## 2018-06-13 MED ORDER — IBUPROFEN 200 MG PO TABS
600.0000 mg | ORAL_TABLET | Freq: Once | ORAL | Status: AC
Start: 2018-06-13 — End: 2018-06-13
  Administered 2018-06-13: 600 mg via ORAL
  Filled 2018-06-13: qty 3

## 2018-06-13 NOTE — ED Triage Notes (Signed)
Pt reports that he slipped 2 days ago and landed on his R wrist. He states that it was reaggravated tonight when he was lifting an 80lb box at work. Decreased ROM noted. Denies pain unless he tries to move it. Endorses tingling in his last 3 fingers for the first 20 minutes after re-injuring it. Denies any head trauma or LOC from the fall 2 days ago.

## 2018-06-13 NOTE — ED Notes (Signed)
Patient needs Large wrist splint which we do not have, security [facility]

## 2018-06-13 NOTE — ED Notes (Signed)
Patient placed in wrist splint. Paitent educated on splint and when to loosen it or when to take off the splint. Patient verbalized understanding and teach back.

## 2018-06-13 NOTE — ED Provider Notes (Signed)
[facility]-EMERGENCY DEPT Provider Note   CSN: [id number] Arrival date & time: 06/13/18  0008     History   Chief Complaint Chief Complaint  Patient presents with  . Wrist Pain    R    HPI Ryan Morrow is a 32 y.o. male.  Patient here for evaluation of right wrist injury that occurred 2 days ago while at work. 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 He states that today he lifted a heavy object and felt a pop in the wrist causing additional pain.   The history is provided by the patient. No language interpreter was used.  Wrist Pain     History reviewed. No pertinent past medical history.  There are no active problems to display for this patient.   History reviewed. No pertinent surgical history.      Home Medications    Prior to Admission medications   Medication Sig Start Date End Date Taking? Authorizing Provider  LORazepam (ATIVAN) 1 MG tablet Take 1 tablet (1 mg total) by mouth every 8 (eight) hours as needed for anxiety. 05/08/18   Irean HongSung, Jade J, MD  ondansetron (ZOFRAN) 4 MG tablet Take 1 tablet (4 mg total) by mouth every 8 (eight) hours as needed for nausea or vomiting. 09/27/16   Governor RooksLord, Rebecca, MD    Family History History reviewed. No pertinent family history.  Social History Social History   Tobacco Use  . Smoking status: Never Smoker  . Smokeless tobacco: Never Used  Substance Use Topics  . Alcohol use: No  . Drug use: No     Allergies   Patient has no known allergies.   Review of Systems Review of Systems  Musculoskeletal:       See HPI  Skin: Negative.  Negative for color change and wound.  Neurological: Positive for numbness (tingling into fingers 3-5).     Physical Exam Updated Vital Signs BP (!) 142/89 (BP Location: Right Arm)   Pulse (!) 56   Temp 98.3 F (36.8 C) (Oral)   Resp 14    Ht 6' (1.829 m)   Wt 105.7 kg   SpO2 98%   BMI 31.60 kg/m   Physical Exam  Constitutional: He is oriented to person, place, and time. He appears well-developed and well-nourished.  Neck: Normal range of motion.  Pulmonary/Chest: Effort normal.  Musculoskeletal: Normal range of motion.  Right wrist has no swelling or discoloration. Extension limited due to discomfort. FROM without strength deficit to all 5 digits.   Neurological: He is alert and oriented to person, place, and time. A sensory deficit (Slight decreased sensation to fingers 3-5.) is present.  Skin: Skin is warm and dry. Capillary refill takes less than 2 seconds.  Psychiatric: He has a normal mood and affect.     ED Treatments / Results  Labs (all labs ordered are listed, but only abnormal results are displayed) Labs Reviewed - No data to display  EKG None  Radiology Dg Wrist Complete Right  Result Date: 06/13/2018 CLINICAL DATA:  Right wrist pain, injury EXAM: RIGHT WRIST - COMPLETE 3+ VIEW COMPARISON:  None. FINDINGS: There is no evidence of fracture or dislocation. There is no evidence of arthropathy or other focal bone abnormality. Soft tissues are unremarkable. IMPRESSION: Negative. Electronically Signed   By: Charlett NoseKevin  Dover M.D.   On: 06/13/2018 00:47  Procedures Procedures (including critical care time)  Medications Ordered in ED Medications - No data to display   Initial Impression / Assessment and Plan / ED Course  I have reviewed the triage vital signs and the nursing notes.  Pertinent labs & imaging results that were available during my care of the patient were reviewed by me and considered in my medical decision making (see chart for details).     Patient with right wrist injury 2 days ago with worsening pain tonight.   Imaging is negative for fracture. Exam does not support tendon or ligament rupture. Will splint for comfort, light duty x 3 days, ibuprofen. Will refer to hand ortho if pain is  not improved in 3-4 days.   Final Clinical Impressions(s) / ED Diagnoses   Final diagnoses:  None   1. Right wrist sprain  ED Discharge Orders    None       Elpidio Anis, Cordelia Poche 06/13/18 Elsie Saas, MD 06/13/18 [phone]

## 2018-07-19 ENCOUNTER — Encounter (HOSPITAL_COMMUNITY): Payer: Self-pay | Admitting: Emergency Medicine

## 2018-07-19 ENCOUNTER — Other Ambulatory Visit: Payer: Self-pay

## 2018-07-19 ENCOUNTER — Emergency Department (HOSPITAL_COMMUNITY)
Admission: EM | Admit: 2018-07-19 | Discharge: 2018-07-20 | Disposition: A | Discharge status: Home / Self Care | Payer: Self-pay | Attending: Emergency Medicine | Admitting: Emergency Medicine

## 2018-07-19 DIAGNOSIS — Z202 Contact with and (suspected) exposure to infections with a predominantly sexual mode of transmission: Secondary | ICD-10-CM | POA: Insufficient documentation

## 2018-07-19 DIAGNOSIS — Z0000 Encounter for general adult medical examination without abnormal findings: Secondary | ICD-10-CM

## 2018-07-19 NOTE — ED Triage Notes (Signed)
Pt reports recently noticing red bumps to penis 3 days ago. Pt states he noticed a foul odor tonight after intercourse. Pt denies penile discharge or pain. Pt reports slight itching

## 2018-07-20 LAB — GC/CHLAMYDIA PROBE AMP (~~LOC~~) NOT AT ARMC
CHLAMYDIA, DNA PROBE: NEGATIVE
NEISSERIA GONORRHEA: NEGATIVE

## 2018-07-20 NOTE — ED Provider Notes (Signed)
[facility] EMERGENCY DEPARTMENT Provider Note   CSN: [id number] Arrival date & time: 07/19/18  2134     History   Chief Complaint Chief Complaint  Patient presents with  . SEXUALLY TRANSMITTED DISEASE    HPI Ryan Morrow is a 32 y.o. male.  Patient presents for symptoms of odor from groin area. He states that several days ago he had a penile rash that resolved. No blisters or pain at that time. Tonight after intercourse, his girlfriend became concerned about an odor coming from his genitalia. No recurrent rash, penile pain, discharge,   The history is provided by the patient. No language interpreter was used.    History reviewed. No pertinent past medical history.  There are no active problems to display for this patient.   History reviewed. No pertinent surgical history.      Home Medications    Prior to Admission medications   Medication Sig Start Date End Date Taking? Authorizing Provider  ibuprofen (ADVIL,MOTRIN) 600 MG tablet Take 1 tablet (600 mg total) by mouth every 6 (six) hours as needed. 06/13/18   Elpidio Anis, PA-C  LORazepam (ATIVAN) 1 MG tablet Take 1 tablet (1 mg total) by mouth every 8 (eight) hours as needed for anxiety. 05/08/18   Irean Hong, MD  ondansetron (ZOFRAN) 4 MG tablet Take 1 tablet (4 mg total) by mouth every 8 (eight) hours as needed for nausea or vomiting. 09/27/16   Governor Rooks, MD    Family History No family history on file.  Social History Social History   Tobacco Use  . Smoking status: Never Smoker  . Smokeless tobacco: Never Used  Substance Use Topics  . Alcohol use: No  . Drug use: No     Allergies   Patient has no known allergies.   Review of Systems Review of Systems  Constitutional: Negative for chills and fever.  Genitourinary: Negative for discharge, dysuria, genital sores, penile pain, penile swelling and testicular pain.       See HPI.  Skin: Negative.  Negative for rash.    Neurological: Negative.      Physical Exam Updated Vital Signs BP 139/85   Pulse 62   Temp 98.3 F (36.8 C)   Resp 16   SpO2 100%   Physical Exam  Constitutional: He is oriented to person, place, and time. He appears well-developed and well-nourished.  Neck: Normal range of motion.  Pulmonary/Chest: Effort normal.  Genitourinary: Penis normal.  Genitourinary Comments: Uncircumcised penis. No blisters, redness, rash. No swelling of foreskin. Foreskin easily retracts. There is a nondescript odor after retracting. No penile discharge or redness/swelling of glans. No inguinal lymphadenopathy.  Musculoskeletal: Normal range of motion.  Neurological: He is alert and oriented to person, place, and time.  Skin: Skin is warm and dry.  Psychiatric: He has a normal mood and affect.     ED Treatments / Results  Labs (all labs ordered are listed, but only abnormal results are displayed) Labs Reviewed - No data to display  EKG None  Radiology No results found.  Procedures Procedures (including critical care time)  Medications Ordered in ED Medications - No data to display   Initial Impression / Assessment and Plan / ED Course  I have reviewed the triage vital signs and the nursing notes.  Pertinent labs & imaging results that were available during my care of the patient were reviewed by me and considered in my medical decision making (see chart for details).  Patient concerned about girlfriends having smelled an odor coming from genitalia tonight. No other symptoms. Exam reassuring. He is requesting GC/chlamydia testing which is done.   Final Clinical Impressions(s) / ED Diagnoses   Final diagnoses:  None   1. Normal genitalia exam  ED Discharge Orders    None       Elpidio Anis, PA-C 07/20/18 0026    Gilda Crease, MD 07/20/18 [phone]

## 2018-07-20 NOTE — ED Notes (Signed)
Patient verbalizes understanding of discharge instructions. Opportunity for questioning and answers were provided. Armband removed by staff, pt discharged from ED.  

## 2018-07-20 NOTE — Discharge Instructions (Addendum)
Follow up with your primary care doctor if symptoms develop that would require further examination.

## 2018-09-08 ENCOUNTER — Encounter: Payer: Self-pay | Admitting: Emergency Medicine

## 2018-09-08 ENCOUNTER — Other Ambulatory Visit: Payer: Self-pay

## 2018-09-08 DIAGNOSIS — Y939 Activity, unspecified: Secondary | ICD-10-CM | POA: Insufficient documentation

## 2018-09-08 DIAGNOSIS — S51812A Laceration without foreign body of left forearm, initial encounter: Secondary | ICD-10-CM | POA: Insufficient documentation

## 2018-09-08 DIAGNOSIS — Z5321 Procedure and treatment not carried out due to patient leaving prior to being seen by health care provider: Secondary | ICD-10-CM | POA: Insufficient documentation

## 2018-09-08 DIAGNOSIS — Y999 Unspecified external cause status: Secondary | ICD-10-CM | POA: Insufficient documentation

## 2018-09-08 DIAGNOSIS — Y929 Unspecified place or not applicable: Secondary | ICD-10-CM | POA: Insufficient documentation

## 2018-09-08 DIAGNOSIS — W268XXA Contact with other sharp object(s), not elsewhere classified, initial encounter: Secondary | ICD-10-CM | POA: Insufficient documentation
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
## 2018-09-09 ENCOUNTER — Emergency Department
Admission: EM | Admit: 2018-09-09 | Discharge: 2018-09-09 | Disposition: A | Discharge status: Home / Self Care | Payer: Self-pay | Attending: Emergency Medicine | Admitting: Emergency Medicine

## 2018-09-10 ENCOUNTER — Emergency Department
Admission: EM | Admit: 2018-09-10 | Discharge: 2018-09-10 | Disposition: A | Discharge status: Home / Self Care | Payer: Self-pay | Attending: Student in an Organized Health Care Education/Training Program | Admitting: Student in an Organized Health Care Education/Training Program

## 2018-09-10 ENCOUNTER — Other Ambulatory Visit: Payer: Self-pay

## 2018-09-10 ENCOUNTER — Encounter: Payer: Self-pay | Admitting: Emergency Medicine

## 2018-09-10 DIAGNOSIS — Z5189 Encounter for other specified aftercare: Secondary | ICD-10-CM

## 2018-09-10 DIAGNOSIS — Z4801 Encounter for change or removal of surgical wound dressing: Secondary | ICD-10-CM | POA: Insufficient documentation

## 2018-09-10 NOTE — ED Provider Notes (Signed)
[facility] Emergency Department Provider Note  ____________________________________________   First MD Initiated Contact with Patient 09/10/18 [phone]     (approximate)  I have reviewed the triage vital signs and the nursing notes.   HISTORY  Chief Complaint Wound Check   HPI Ryan Morrow is a 32 y.o. male presents to the ED for a wound check.  Patient states that he had a laceration to his arm yesterday and was repaired at a clinic in [place].  Patient states this morning he "bumped" his arm at work and that there was some blood.  His supervisor states that he needs a note giving his him clearance to return to work with no restrictions.   History reviewed. No pertinent past medical history.  There are no active problems to display for this patient.   History reviewed. No pertinent surgical history.  Prior to Admission medications   Medication Sig Start Date End Date Taking? Authorizing Provider  LORazepam (ATIVAN) 1 MG tablet Take 1 tablet (1 mg total) by mouth every 8 (eight) hours as needed for anxiety. 05/08/18   Irean HongSung, Jade J, MD    Allergies Patient has no known allergies.  No family history on file.  Social History Social History   Tobacco Use  . Smoking status: Never Smoker  . Smokeless tobacco: Never Used  Substance Use Topics  . Alcohol use: No  . Drug use: No    Review of Systems Constitutional: No fever/chills Cardiovascular: Denies chest pain. Respiratory: Denies shortness of breath. Musculoskeletal: Negative for left forearm pain. Skin: Sutured laceration left forearm. Neurological: Negative for headaches, focal weakness or numbness. ____________________________________________   PHYSICAL EXAM:  VITAL SIGNS: ED Triage Vitals [09/10/18 0654]  Enc Vitals Group     BP (!) 147/92     Pulse Rate 72     Resp 18     Temp 98 F (36.7 C)     Temp Source Oral     SpO2 98 %     Weight 230 lb (104.3 kg)     Height 5'  11" (1.803 m)     Head Circumference      Peak Flow      Pain Score 0     Pain Loc      Pain Edu?      Excl. in GC?    Constitutional: Alert and oriented. Well appearing and in no acute distress. Eyes: Conjunctivae are normal.  Head: Atraumatic. Neck: No stridor.   Cardiovascular: Normal rate, regular rhythm. Grossly normal heart sounds.  Good peripheral circulation. Respiratory: Normal respiratory effort.  No retractions. Lungs CTAB. Musculoskeletal: Examination of left arm there is a well sutured and healing laceration to the left forearm without active bleeding and no foreign body noted.  There is no erythema or drainage at this time.  Area is nontender to palpation. Neurologic:  Normal speech and language. No gross focal neurologic deficits are appreciated.  Skin:  Skin is warm, dry and intact.  Sutured area appears to be healing without any signs of infection. Psychiatric: Mood and affect are normal. Speech and behavior are normal.  ____________________________________________   LABS (all labs ordered are listed, but only abnormal results are displayed)  Labs Reviewed - No data to display  PROCEDURES  Procedure(s) performed: None  Procedures  Critical Care performed: No  ____________________________________________   INITIAL IMPRESSION / ASSESSMENT AND PLAN / ED COURSE  As part of my medical decision making, I reviewed the following data within  the electronic MEDICAL RECORD NUMBER Notes from prior ED visits and [place] Controlled Substance Database  Patient was given a note to return to work with precautions to keep the area clean and dry.  He may perform his regular duties.  New dressing was applied.  Patient is to return for suture removal at the time instructed to him by the clinic.  ____________________________________________   FINAL CLINICAL IMPRESSION(S) / ED DIAGNOSES  Final diagnoses:  Encounter for wound re-check     ED Discharge Orders    None        Note:  This document was prepared using Dragon voice recognition software and may include unintentional dictation errors.    Tommi Rumps, PA-C 09/10/18 0754    Willy Eddy, MD 09/10/18 [phone]

## 2018-09-10 NOTE — Discharge Instructions (Addendum)
Follow-up with your primary care provider or the clinic in [place] if any continued problems and for suture removal as they instructed.  Keep area clean and dry.  Watch for any signs of infection. You may take Tylenol as needed for discomfort. Clean area daily with mild soap and water and change dressing daily as well.
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
## 2018-09-11 ENCOUNTER — Emergency Department
Admission: EM | Admit: 2018-09-11 | Discharge: 2018-09-11 | Disposition: A | Discharge status: Home / Self Care | Payer: Self-pay | Attending: Emergency Medicine | Admitting: Emergency Medicine

## 2018-09-11 ENCOUNTER — Encounter: Payer: Self-pay | Admitting: Emergency Medicine

## 2018-09-11 DIAGNOSIS — Z0289 Encounter for other administrative examinations: Secondary | ICD-10-CM

## 2018-09-11 DIAGNOSIS — Z0279 Encounter for issue of other medical certificate: Secondary | ICD-10-CM | POA: Insufficient documentation

## 2018-09-11 NOTE — Discharge Instructions (Signed)
Have your sutures removed as previously advised

## 2018-09-11 NOTE — ED Triage Notes (Signed)
Patient states that he was seen at [facility] Thursday for laceration to his left forearm. Patient states that on Friday at work he dropped something on his arm which caused it to start bleeding. Patient states that his employer wanted him to be seen to get a note for work stating that he did not have any restrictions. Patient states that he was seen here Friday and received the note for work. Patient states that when he went to work tonight he was told that he needs a note for work with restrictions. Patient is here for a note for work.

## 2018-09-11 NOTE — ED Provider Notes (Signed)
[facility] Emergency Department Provider Note  ____________________________________________  Time seen: Approximately 9:46 PM  I have reviewed the triage vital signs and the nursing notes.   HISTORY  Chief Complaint Wound Check   HPI Ryan Morrow is a 32 y.o. male presenting to the emergency department for a work excuse that lists any restrictions he may have due to a sutured wound on his left forearm.  Patient states that 1 of the sutures busted while he was at work on Friday which caused the area to bleed.  He was evaluated here and released back to work but did not have any restrictions listed on his excuse.  His supervisor would not allow him to clock in this evening until he came to the hospital and got a note to be on some type of light duty.  Supervisor is concerned that the wound will become infected or the sutures will rupture and then the case will be Workmen's Comp.  History reviewed. No pertinent past medical history.  There are no active problems to display for this patient.   History reviewed. No pertinent surgical history.  Prior to Admission medications   Medication Sig Start Date End Date Taking? Authorizing Provider  LORazepam (ATIVAN) 1 MG tablet Take 1 tablet (1 mg total) by mouth every 8 (eight) hours as needed for anxiety. 05/08/18   Irean Hong, MD    Allergies Patient has no known allergies.  No family history on file.  Social History Social History   Tobacco Use  . Smoking status: Never Smoker  . Smokeless tobacco: Never Used  Substance Use Topics  . Alcohol use: No  . Drug use: No    Review of Systems Constitutional: Negative for fever. Skin: Positive for previously sutured wound on the left forearm Neurological: Negative for headaches, focal weakness or numbness.  ____________________________________________   PHYSICAL EXAM:  VITAL SIGNS: ED Triage Vitals [09/11/18 2001]  Enc Vitals Group     BP (!) 144/98      Pulse Rate 73     Resp 18     Temp 97.9 F (36.6 C)     Temp Source Oral     SpO2 99 %     Weight 230 lb (104.3 kg)     Height 5\' 11"  (1.803 m)     Head Circumference      Peak Flow      Pain Score 0     Pain Loc      Pain Edu?      Excl. in GC?     Constitutional: Alert and oriented. Well appearing and in no acute distress. Eyes: Conjunctivae are normal. Neck: No stridor.  Cardiovascular: Good peripheral circulation. Respiratory: Normal respiratory effort. Musculoskeletal: Full ROM throughout.  Neurologic:  Normal speech and language. No gross focal neurologic deficits are appreciated. Speech is normal. No gait instability. Skin:  Sutured area appears to be healing well.  Psychiatric: Mood and affect are normal. Speech and behavior are normal.  ____________________________________________   LABS (all labs ordered are listed, but only abnormal results are displayed)  Labs Reviewed - No data to display ____________________________________________  EKG  Not indicated ____________________________________________  RADIOLOGY  Not indicated ____________________________________________   PROCEDURES  None ____________________________________________   INITIAL IMPRESSION / ASSESSMENT AND PLAN / ED COURSE     Pertinent labs & imaging results that were available during my care of the patient were reviewed by me and considered in my medical decision making (see chart for  details).  Patient was advised to have the sutures removed as previously advised.  He was encouraged to return to the emergency department for symptoms of concern if unable to see his primary care doctor.. ____________________________________________   FINAL CLINICAL IMPRESSION(S) / ED DIAGNOSES  Final diagnoses:  Encounter to obtain excuse from work       Chinita Pesterriplett, Kyndall Chaplin B, [place]NP 09/11/18 2151    Jene EveryKinner, Robert, MD 09/11/18 2154

## 2018-09-11 NOTE — ED Notes (Signed)
Pt here  For  l arm  Recheck    Pt  Is  Asking for note  That states he can return  With restrictions

## 2018-10-21 ENCOUNTER — Other Ambulatory Visit: Payer: Self-pay

## 2018-10-21 ENCOUNTER — Emergency Department
Admission: EM | Admit: 2018-10-21 | Discharge: 2018-10-21 | Disposition: A | Discharge status: Home / Self Care | Payer: Self-pay | Attending: Emergency Medicine | Admitting: Emergency Medicine

## 2018-10-21 ENCOUNTER — Encounter: Payer: Self-pay | Admitting: Emergency Medicine

## 2018-10-21 DIAGNOSIS — F419 Anxiety disorder, unspecified: Secondary | ICD-10-CM | POA: Insufficient documentation

## 2018-10-21 DIAGNOSIS — M25512 Pain in left shoulder: Secondary | ICD-10-CM | POA: Insufficient documentation

## 2018-10-21 MED ORDER — MELOXICAM 15 MG PO TABS: 15.0000 mg | ORAL_TABLET | Freq: Every day | ORAL | 0 refills | Status: DC

## 2018-10-21 MED ORDER — HYDROCODONE-ACETAMINOPHEN 5-325 MG PO TABS
1.0000 | ORAL_TABLET | Freq: Once | ORAL | Status: AC
  Administered 2018-10-21: 1 via ORAL
  Filled 2018-10-21: qty 1
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
## 2018-10-21 NOTE — ED Notes (Signed)
Pt verbalized understanding of discharge instructions. NAD at this time. 
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
## 2018-10-21 NOTE — ED Notes (Signed)
Pt requires referrals for psychiatric evaluation. Pt states he is cutting himself again. Pt denies HI/SI at this time. Pt states when he was taking ativan prn for anxiety, he stopped cutting.

## 2019-07-13 ENCOUNTER — Other Ambulatory Visit: Payer: Self-pay | Admitting: *Deleted

## 2019-07-13 DIAGNOSIS — Z20822 Contact with and (suspected) exposure to covid-19: Secondary | ICD-10-CM

## 2019-07-14 LAB — NOVEL CORONAVIRUS, NAA: SARS-CoV-2, NAA: NOT DETECTED

## 2019-12-25 ENCOUNTER — Other Ambulatory Visit: Payer: Self-pay

## 2019-12-25 ENCOUNTER — Encounter: Payer: Self-pay | Admitting: Emergency Medicine

## 2019-12-25 DIAGNOSIS — R519 Headache, unspecified: Secondary | ICD-10-CM | POA: Insufficient documentation

## 2019-12-25 DIAGNOSIS — Z5321 Procedure and treatment not carried out due to patient leaving prior to being seen by health care provider: Secondary | ICD-10-CM | POA: Insufficient documentation

## 2019-12-25 NOTE — ED Triage Notes (Signed)
Patient ambulatory to triage with steady gait, without difficulty or distress noted, mask in place; pt reports frontal HA radiating around into back accomp by nausea since yesterday

## 2019-12-26 ENCOUNTER — Emergency Department
Admission: EM | Admit: 2019-12-26 | Discharge: 2019-12-26 | Discharge status: Against Medical Advice | Payer: Self-pay | Attending: Emergency Medicine | Admitting: Emergency Medicine

## 2019-12-26 NOTE — ED Notes (Signed)
No answer when called several times from lobby 

## 2020-02-08 ENCOUNTER — Ambulatory Visit: Payer: Self-pay | Attending: Family

## 2020-02-08 DIAGNOSIS — Z20822 Contact with and (suspected) exposure to covid-19: Secondary | ICD-10-CM

## 2020-02-09 LAB — SARS-COV-2, NAA 2 DAY TAT

## 2020-02-09 LAB — NOVEL CORONAVIRUS, NAA: SARS-CoV-2, NAA: NOT DETECTED

## 2020-02-23 ENCOUNTER — Other Ambulatory Visit: Payer: Self-pay

## 2020-02-26 ENCOUNTER — Ambulatory Visit: Payer: Self-pay | Attending: Family

## 2020-02-26 ENCOUNTER — Other Ambulatory Visit: Payer: Self-pay

## 2020-02-26 DIAGNOSIS — Z20822 Contact with and (suspected) exposure to covid-19: Secondary | ICD-10-CM

## 2020-02-27 LAB — NOVEL CORONAVIRUS, NAA: SARS-CoV-2, NAA: NOT DETECTED

## 2020-02-27 LAB — SARS-COV-2, NAA 2 DAY TAT

## 2020-03-14 IMAGING — MR MR HEAD W/O CM
10 series · 48 of 48 positions shown · non-contrast
Comparison: Prior CT from earlier the same day.

CLINICAL DATA: Initial evaluation for acute memory loss.

EXAM:
MRI HEAD WITHOUT CONTRAST
TECHNIQUE: Multiplanar, multiecho pulse sequences of the brain and surrounding
structures were obtained without intravenous contrast.

[Series 2: T1 · sagittal · 5.0mm · 0.45mm/px · 2 of 26 slices shown (1 of 2)]
[im 1/26]
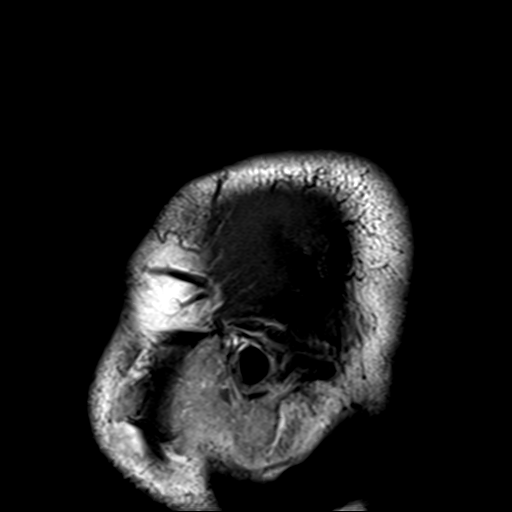
[im 26/26]
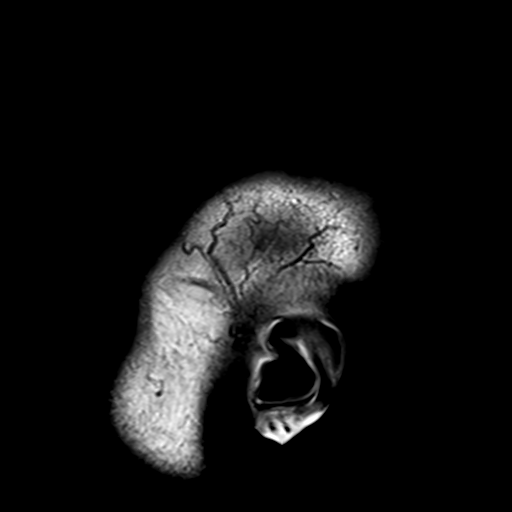

[Series 4: DWI · axial · 3.0mm · 1.80mm/px · z∈[-35,+122]mm · 5 of 55 slices shown (1 of 4)]
[im 1/55]
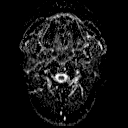
[im 14/55]
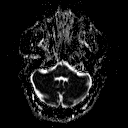
[im 28/55]
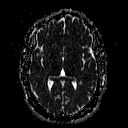
[im 41/55]
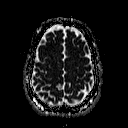
[im 55/55]
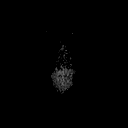

[Series 6: DWI · coronal · 3.0mm · 1.80mm/px · 4 of 47 slices shown (2 of 4)]
[im 1/47]
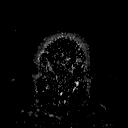
[im 16/47]
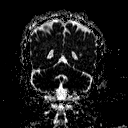
[im 31/47]
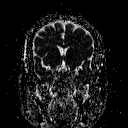
[im 47/47]
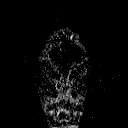

[Series 7: T2 · axial · 5.0mm · 0.72mm/px · z∈[-23,+126]mm · 2 of 25 slices shown (1 of 3)]
[im 1/25]
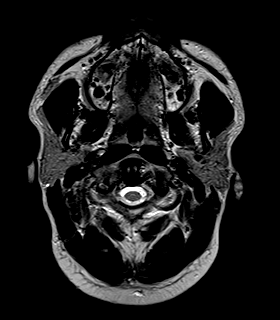
[im 25/25]
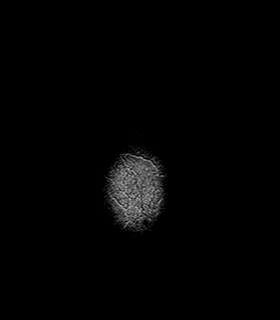

[Series 8: FLAIR · axial · 3.0mm · 0.45mm/px · z∈[-23,+126]mm · 5 of 53 slices shown]
[im 1/53]
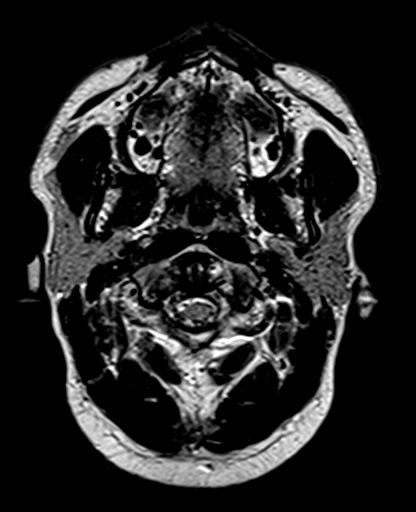
[im 14/53]
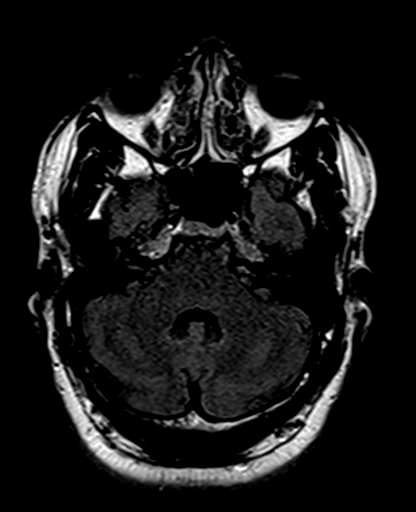
[im 27/53]
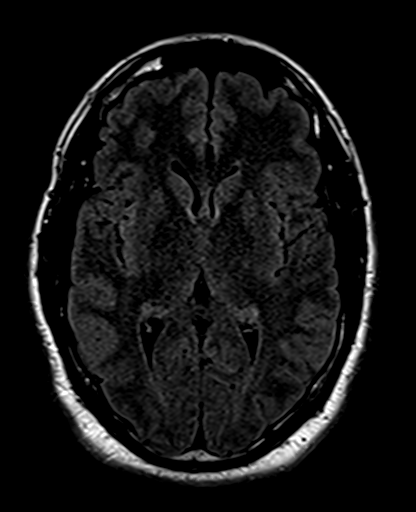
[im 40/53]
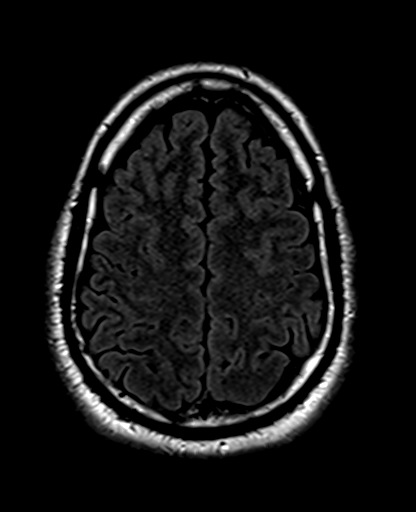
[im 53/53]
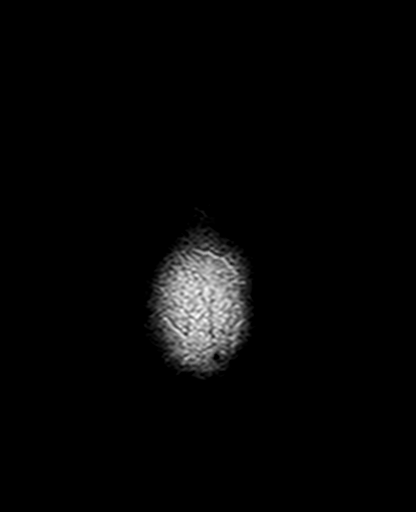

[Series 9: T2 · axial · 5.0mm · 0.45mm/px · z∈[-23,+126]mm · 2 of 25 slices shown (2 of 3)]
[im 1/25]
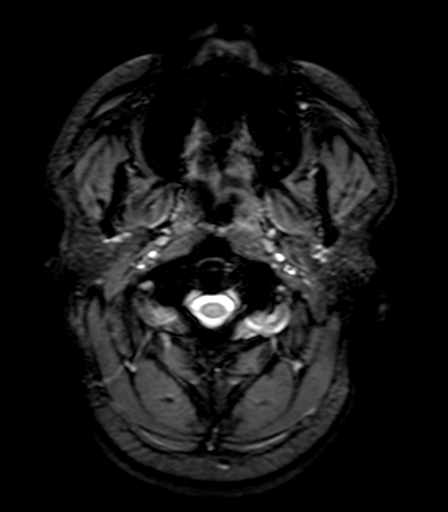
[im 25/25]
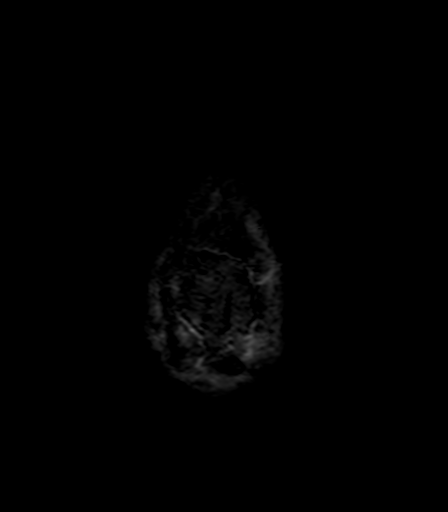

[Series 10: T1 · axial · 1.0mm · 1.00mm/px · z∈[-28,+139]mm · 16 of 176 slices shown (2 of 2)]
[im 1/176]
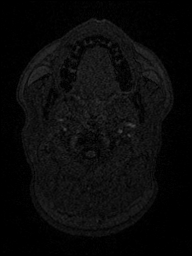
[im 12/176]
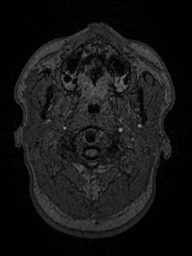
[im 24/176]
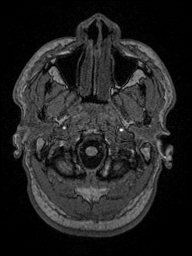
[im 36/176]
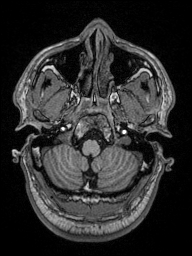
[im 47/176]
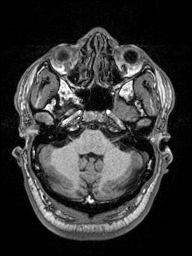
[im 59/176]
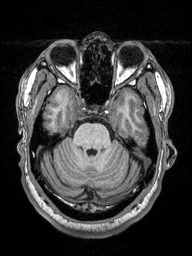
[im 71/176]
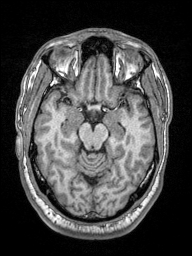
[im 82/176]
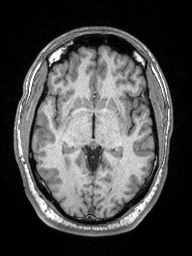
[im 94/176]
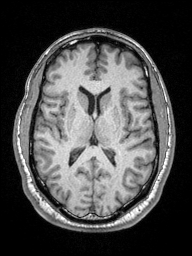
[im 106/176]
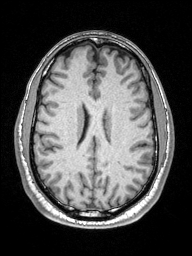
[im 117/176]
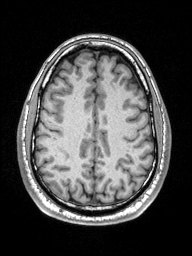
[im 129/176]
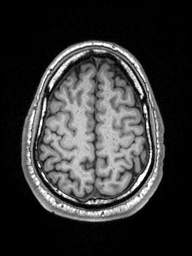
[im 141/176]
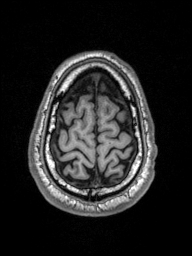
[im 152/176]
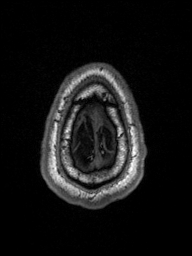
[im 164/176]
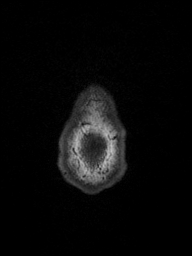
[im 176/176]
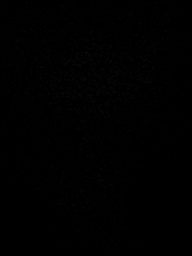

[Series 11: T2 · coronal · 5.0mm · 0.69mm/px · 3 of 30 slices shown (3 of 3)]
[im 1/30]
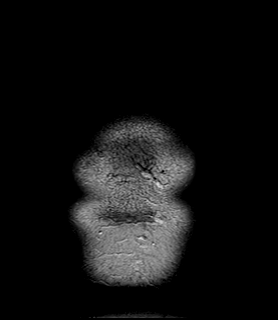
[im 15/30]
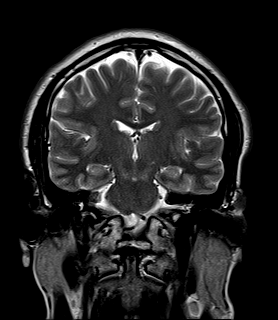
[im 30/30]
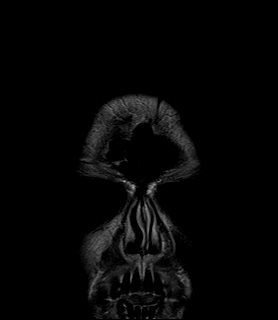

[Series 100: DWI · axial · 3.0mm · 1.80mm/px · z∈[-35,+122]mm · 5 of 55 slices shown (3 of 4)]
[im 1/55]
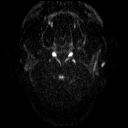
[im 14/55]
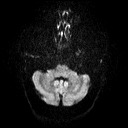
[im 28/55]
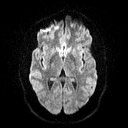
[im 41/55]
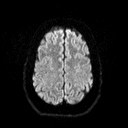
[im 55/55]
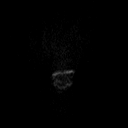

[Series 101: DWI · coronal · 3.0mm · 1.80mm/px · 4 of 47 slices shown (4 of 4)]
[im 1/47]
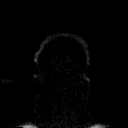
[im 16/47]
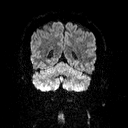
[im 31/47]
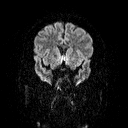
[im 47/47]
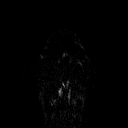

[48 of 48 positions shown; findings below may reference images not displayed]

FINDINGS: Brain: Cerebral volume within normal limits for patient age. No
focal parenchymal signal abnormality identified. No abnormal foci of
restricted diffusion to suggest acute or subacute ischemia.
Gray-white matter differentiation well maintained. No
encephalomalacia to suggest chronic infarction. No foci of
susceptibility artifact to suggest acute or chronic intracranial
hemorrhage.

Mass lesion, midline shift or mass effect. No hydrocephalus. No
extra-axial fluid collection. Major dural sinuses are grossly
patent.

Pituitary gland and suprasellar region are normal. Midline
structures intact and normal.

Vascular: Major intracranial vascular flow voids well maintained and
normal in appearance.

Skull and upper cervical spine: Craniocervical junction normal.
Visualized upper cervical spine within normal limits. Bone marrow
signal intensity normal. No scalp soft tissue abnormality.

Sinuses/Orbits: Globes and orbital soft tissues within normal
limits. Paranasal sinuses are clear. No mastoid effusion. Inner ear
structures normal.

Other: None.
IMPRESSION: Normal brain MRI.  No acute intracranial abnormality identified.

## 2020-04-20 ENCOUNTER — Other Ambulatory Visit: Payer: Self-pay

## 2020-04-20 ENCOUNTER — Emergency Department
Admission: EM | Admit: 2020-04-20 | Discharge: 2020-04-20 | Disposition: A | Discharge status: Home / Self Care | Payer: Self-pay | Attending: Emergency Medicine | Admitting: Emergency Medicine

## 2020-04-20 DIAGNOSIS — Z5321 Procedure and treatment not carried out due to patient leaving prior to being seen by health care provider: Secondary | ICD-10-CM | POA: Insufficient documentation

## 2020-04-20 DIAGNOSIS — K0889 Other specified disorders of teeth and supporting structures: Secondary | ICD-10-CM | POA: Insufficient documentation

## 2020-04-20 NOTE — ED Triage Notes (Signed)
Patient reports left upper dental pain for 4 days.

## 2020-06-12 ENCOUNTER — Other Ambulatory Visit: Payer: Self-pay

## 2020-06-12 DIAGNOSIS — Z20822 Contact with and (suspected) exposure to covid-19: Secondary | ICD-10-CM

## 2020-06-14 LAB — NOVEL CORONAVIRUS, NAA: SARS-CoV-2, NAA: NOT DETECTED

## 2020-06-14 LAB — SARS-COV-2, NAA 2 DAY TAT

## 2020-10-21 ENCOUNTER — Other Ambulatory Visit: Payer: Self-pay

## 2020-10-21 DIAGNOSIS — Z20822 Contact with and (suspected) exposure to covid-19: Secondary | ICD-10-CM

## 2020-10-23 LAB — SARS-COV-2, NAA 2 DAY TAT

## 2020-10-23 LAB — NOVEL CORONAVIRUS, NAA: SARS-CoV-2, NAA: DETECTED — AB

## 2022-08-14 ENCOUNTER — Other Ambulatory Visit: Payer: Self-pay

## 2022-08-14 ENCOUNTER — Emergency Department
Admission: EM | Admit: 2022-08-14 | Discharge: 2022-08-14 | Disposition: A | Discharge status: Home / Self Care | Payer: Self-pay | Attending: Student in an Organized Health Care Education/Training Program | Admitting: Student in an Organized Health Care Education/Training Program

## 2022-08-14 DIAGNOSIS — Z20822 Contact with and (suspected) exposure to covid-19: Secondary | ICD-10-CM | POA: Insufficient documentation

## 2022-08-14 DIAGNOSIS — R197 Diarrhea, unspecified: Secondary | ICD-10-CM | POA: Insufficient documentation

## 2022-08-14 DIAGNOSIS — D72829 Elevated white blood cell count, unspecified: Secondary | ICD-10-CM | POA: Insufficient documentation

## 2022-08-14 DIAGNOSIS — R112 Nausea with vomiting, unspecified: Secondary | ICD-10-CM | POA: Insufficient documentation

## 2022-08-14 LAB — CBC WITH DIFFERENTIAL/PLATELET
Abs Immature Granulocytes: 0.03 10*3/uL (ref 0.00–0.07)
Basophils Absolute: 0 10*3/uL (ref 0.0–0.1)
Basophils Relative: 0 %
Eosinophils Absolute: 0.3 10*3/uL (ref 0.0–0.5)
Eosinophils Relative: 2 %
HCT: 49 % (ref 39.0–52.0)
Hemoglobin: 15.6 g/dL (ref 13.0–17.0)
Immature Granulocytes: 0 %
Lymphocytes Relative: 5 %
Lymphs Abs: 0.6 10*3/uL — ABNORMAL LOW (ref 0.7–4.0)
MCH: 26.5 pg (ref 26.0–34.0)
MCHC: 31.8 g/dL (ref 30.0–36.0)
MCV: 83.2 fL (ref 80.0–100.0)
Monocytes Absolute: 0.5 10*3/uL (ref 0.1–1.0)
Monocytes Relative: 4 %
Neutro Abs: 10 10*3/uL — ABNORMAL HIGH (ref 1.7–7.7)
Neutrophils Relative %: 89 %
Platelets: 346 10*3/uL (ref 150–400)
RBC: 5.89 MIL/uL — ABNORMAL HIGH (ref 4.22–5.81)
RDW: 14.6 % (ref 11.5–15.5)
WBC: 11.4 10*3/uL — ABNORMAL HIGH (ref 4.0–10.5)
nRBC: 0 % (ref 0.0–0.2)

## 2022-08-14 LAB — COMPREHENSIVE METABOLIC PANEL
ALT: 33 U/L (ref 0–44)
AST: 31 U/L (ref 15–41)
Albumin: 4.8 g/dL (ref 3.5–5.0)
Alkaline Phosphatase: 59 U/L (ref 38–126)
Anion gap: 7 (ref 5–15)
BUN: 11 mg/dL (ref 6–20)
CO2: 28 mmol/L (ref 22–32)
Calcium: 9.3 mg/dL (ref 8.9–10.3)
Chloride: 105 mmol/L (ref 98–111)
Creatinine, Ser: 0.88 mg/dL (ref 0.61–1.24)
GFR, Estimated: >60 mL/min (ref >60)
Glucose, Bld: 107 mg/dL — ABNORMAL HIGH (ref 70–99)
Potassium: 4.2 mmol/L (ref 3.5–5.1)
Sodium: 140 mmol/L (ref 135–145)
Total Bilirubin: 1.4 mg/dL — ABNORMAL HIGH (ref 0.3–1.2)
Total Protein: 8.6 g/dL — ABNORMAL HIGH (ref 6.5–8.1)

## 2022-08-14 LAB — URINALYSIS, ROUTINE W REFLEX MICROSCOPIC
Bilirubin Urine: NEGATIVE
Glucose, UA: NEGATIVE mg/dL
Hgb urine dipstick: NEGATIVE
Ketones, ur: NEGATIVE mg/dL
Leukocytes,Ua: NEGATIVE
Nitrite: NEGATIVE
Protein, ur: NEGATIVE mg/dL
Specific Gravity, Urine: 1.014 (ref 1.005–1.030)
pH: 7 (ref 5.0–8.0)

## 2022-08-14 LAB — LIPASE, BLOOD: Lipase: 29 U/L (ref 11–51)

## 2022-08-14 LAB — CHLAMYDIA/NGC RT PCR (ARMC ONLY)
Chlamydia Tr: NOT DETECTED
N gonorrhoeae: NOT DETECTED

## 2022-08-14 LAB — SARS CORONAVIRUS 2 BY RT PCR: SARS Coronavirus 2 by RT PCR: NEGATIVE

## 2022-08-14 LAB — HIV ANTIBODY (ROUTINE TESTING W REFLEX): HIV Screen 4th Generation wRfx: NONREACTIVE

## 2022-08-14 MED ORDER — ONDANSETRON 4 MG PO TBDP
4.0000 mg | ORAL_TABLET | Freq: Once | ORAL | Status: AC
  Administered 2022-08-14: 4 mg via ORAL
  Filled 2022-08-14: qty 1

## 2022-08-14 MED ORDER — ONDANSETRON 4 MG PO TBDP: 4.0000 mg | ORAL_TABLET | Freq: Three times a day (TID) | ORAL | 0 refills | Status: AC | PRN

## 2022-08-14 MED ORDER — SODIUM CHLORIDE 0.9 % IV BOLUS
1000.0000 mL | Freq: Once | INTRAVENOUS | Status: AC
  Administered 2022-08-14: 1000 mL via INTRAVENOUS

## 2022-08-14 NOTE — ED Triage Notes (Addendum)
Pt arrives with n/v/d that started this morning. Pt denies ABD pain and fever. Pt endorses headache. Pt would also like a STD check.

## 2022-08-14 NOTE — ED Provider Notes (Signed)
Provider Note  Patient Contact: 3:15 PM (approximate)   History   Emesis   HPI  Ryan Morrow is a 36 y.o. male with an unremarkable past medical history, presents to the emergency department with concern for nausea, vomiting and diarrhea that started this morning.  Patient states that he has no known sick contacts to his knowledge.  He reports that he recently found out that his girlfriend has been working as a prostitute and is concern for STDs.  He denies penile discharge, hematuria, flank pain or fever.  He has had chills.  Has not had routine testing for HIV or syphilis in the past year.  No chest pain, chest tightness or shortness of breath.      Physical Exam   Triage Vital Signs: ED Triage Vitals  Enc Vitals Group     BP 08/14/22 1417 (!) 144/97     Pulse Rate 08/14/22 1417 73     Resp 08/14/22 1417 18     Temp 08/14/22 1417 98.3 F (36.8 C)     Temp Source 08/14/22 1417 Oral     SpO2 08/14/22 1417 96 %     Weight 08/14/22 1406 247 lb (112 kg)     Height --      Head Circumference --      Peak Flow --      Pain Score 08/14/22 1406 0     Pain Loc --      Pain Edu? --      Excl. in [place]? --     Most recent vital signs: Vitals:   08/14/22 1417 08/14/22 1730  BP: (!) 144/97 (!) 164/97  Pulse: 73 81  Resp: 18 18  Temp: 98.3 F (36.8 C) 98.6 F (37 C)  SpO2: 96% 100%     General: Alert and in no acute distress. Eyes:  PERRL. EOMI. Head: No acute traumatic findings ENT:      Nose: No congestion/rhinnorhea.      Mouth/Throat: Mucous membranes are moist. Neck: No stridor. No cervical spine tenderness to palpation. Cardiovascular:  Good peripheral perfusion Respiratory: Normal respiratory effort without tachypnea or retractions. Lungs CTAB. Good air entry to the bases with no decreased or absent breath sounds. Gastrointestinal: Bowel sounds 4 quadrants. Soft and nontender to palpation. No guarding or rigidity. No palpable masses. No distention. No CVA  tenderness. Musculoskeletal: Full range of motion to all extremities.  Neurologic:  No gross focal neurologic deficits are appreciated.  Skin:   No rash noted Other:   ED Results / Procedures / Treatments   Labs (all labs ordered are listed, but only abnormal results are displayed) Labs Reviewed  CBC WITH DIFFERENTIAL/PLATELET - Abnormal; Notable for the following components:      Result Value   WBC 11.4 (*)    RBC 5.89 (*)    Neutro Abs 10.0 (*)    Lymphs Abs 0.6 (*)    All other components within normal limits  COMPREHENSIVE METABOLIC PANEL - Abnormal; Notable for the following components:   Glucose, Bld 107 (*)    Total Protein 8.6 (*)    Total Bilirubin 1.4 (*)    All other components within normal limits  URINALYSIS, ROUTINE W REFLEX MICROSCOPIC - Abnormal; Notable for the following components:   Color, Urine YELLOW (*)    APPearance CLEAR (*)    All other components within normal limits  SARS CORONAVIRUS 2 BY RT PCR  CHLAMYDIA/NGC RT PCR ([facility] ONLY)  LIPASE, BLOOD  RPR  HIV ANTIBODY (ROUTINE TESTING W REFLEX)        PROCEDURES:  Critical Care performed: No  Procedures   MEDICATIONS ORDERED IN ED: Medications  sodium chloride 0.9 % bolus 1,000 mL (1,000 mLs Intravenous New Bag/Given 08/14/22 1626)  ondansetron (ZOFRAN-ODT) disintegrating tablet 4 mg (4 mg Oral Given 08/14/22 1624)     IMPRESSION / MDM / ASSESSMENT AND PLAN / ED COURSE  I reviewed the triage vital signs and the nursing notes.                              Assessment and plan Vomiting Diarrhea 36 year old male with largely unremarkable past medical history presents to the emergency department with nausea, vomiting and diarrhea that started this morning.  Vital signs were reassuring at triage.  On exam, patient was alert and nontoxic-appearing.  Given recent unprotected sex, will test for gonorrhea chlamydia, HIV and syphilis.  Will give patient supplemental fluids and  antiemetics and will reassess.  COVID-19 testing in process.   Patient felt improved after supplemental fluids.  CBC indicated mildly elevated white blood cell count.  CMP within reference range.  Lipase within reference range.  COVID-19 negative.  Urinalysis shows no signs of UTI.  Patient was discharged with a short course of Zofran for nausea and vomiting.  Return precautions were given to return with new or worsening symptoms.   FINAL CLINICAL IMPRESSION(S) / ED DIAGNOSES   Final diagnoses:  Nausea vomiting and diarrhea     Rx / DC Orders   ED Discharge Orders          Ordered    ondansetron (ZOFRAN-ODT) 4 MG disintegrating tablet  Every 8 hours PRN        08/14/22 1835             Note:  This document was prepared using Dragon voice recognition software and may include unintentional dictation errors.   Pia Mau [place], PA-C 08/14/22 1841    Merwyn Katos, MD 08/23/22 [phone]

## 2022-08-14 NOTE — Discharge Instructions (Addendum)
Take Zofran up to three times daily

## 2022-08-15 LAB — RPR: RPR Ser Ql: NONREACTIVE

## 2023-11-08 ENCOUNTER — Ambulatory Visit: Payer: Self-pay | Admitting: Family Medicine

## 2023-11-08 ENCOUNTER — Encounter: Payer: Self-pay | Admitting: Family Medicine

## 2023-11-08 VITALS — BP 136/80 | HR 67 | Resp 16 | Ht 72.0 in | Wt 263.1 lb

## 2023-11-08 DIAGNOSIS — Z8249 Family history of ischemic heart disease and other diseases of the circulatory system: Secondary | ICD-10-CM | POA: Diagnosis not present

## 2023-11-08 DIAGNOSIS — F5104 Psychophysiologic insomnia: Secondary | ICD-10-CM | POA: Insufficient documentation

## 2023-11-08 DIAGNOSIS — E66812 Obesity, class 2: Secondary | ICD-10-CM | POA: Insufficient documentation

## 2023-11-08 DIAGNOSIS — F902 Attention-deficit hyperactivity disorder, combined type: Secondary | ICD-10-CM

## 2023-11-08 DIAGNOSIS — I10 Essential (primary) hypertension: Secondary | ICD-10-CM | POA: Diagnosis not present

## 2023-11-08 DIAGNOSIS — F900 Attention-deficit hyperactivity disorder, predominantly inattentive type: Secondary | ICD-10-CM | POA: Insufficient documentation

## 2023-11-08 MED ORDER — AMPHETAMINE-DEXTROAMPHETAMINE 10 MG PO TABS: 10.0000 mg | ORAL_TABLET | Freq: Two times a day (BID) | ORAL | 0 refills | Status: DC

## 2023-11-08 MED ORDER — HYDROCHLOROTHIAZIDE 25 MG PO TABS: 25.0000 mg | ORAL_TABLET | Freq: Every day | ORAL | 1 refills | Status: DC

## 2023-11-08 NOTE — Progress Notes (Signed)
New patient visit   Patient: Ryan Morrow   DOB: 08-May-1986   38 y.o. Male  MRN: [id number]  Visit Date: 11/08/2023  Today's healthcare provider: Ronnald Ramp, MD   Chief Complaint  Patient presents with   Establish Care    Would like to discuss Ozempic for weight loss and restarting Adderall   Subjective    Ryan Morrow is a 38 y.o. male who presents today as a new patient to establish care.   HPI     Establish Care    Additional comments: Would like to discuss Ozempic for weight loss and restarting Adderall      Last edited by Ashok Cordia, CMA on 11/08/2023  3:44 PM.       Discussed the use of AI scribe software for clinical note transcription with the patient, who gave verbal consent to proceed.  History of Present Illness   The patient, a 38 year old with a history of hypertension, presents for establishing care and discussing weight management. The patient's blood pressure was elevated at 139/94, and his BMI was calculated at 35.68, indicating obesity. The patient is currently on amlodipine 5mg  daily for hypertension.  The patient expressed concern about his health status, prompted by the sudden death of a coworker due to a heart attack. The patient has not had a recent physical examination and has not been regularly monitoring his health due to lack of health coverage until recently.  The patient has a history of ADHD, which was managed with Ritalin and Adderall during his school years but has been unmedicated since the age of 36. The patient reports difficulty with focus and retention of information, which he manages by turning tasks into games. However, he expresses a desire to regain the energy and focus he experienced while on Adderall.  The patient also reports symptoms suggestive of anxiety and depression, including periods of not wanting to talk, procrastination, and difficulty turning off his thoughts. He has previously tried therapy but did  not agree with the therapist's diagnosis of depression and anxiety. The patient denies any suicidal ideation or attempts but acknowledges occasional thoughts of being better off dead.  The patient's lifestyle includes working second shift, which has led to poor eating habits, including late-night snacking and reliance on fast food. He has made some dietary changes, such as reducing pork intake, eliminating soda, and trying to limit sodium intake. However, he acknowledges struggling with maintaining these changes consistently. The patient also reports frequent urination, particularly at night, which he attributes to his current hypertension medication, amlodipine.  The patient has two daughters, aged 19 and 82, and expresses concern about being alert and available for them, which contributes to his reluctance to take sleep aids. He also reports a family history of heart disease, with multiple relatives dying from heart attacks in their early 61s.          Past Medical History:  Diagnosis Date   ADHD    Anxiety    Depression    Hypertension     Outpatient Medications Prior to Visit  Medication Sig   amLODipine (NORVASC) 5 MG tablet Take 5 mg by mouth daily.   [DISCONTINUED] LORazepam (ATIVAN) 1 MG tablet Take 1 tablet (1 mg total) by mouth every 8 (eight) hours as needed for anxiety.   [DISCONTINUED] meloxicam (MOBIC) 15 MG tablet Take 1 tablet (15 mg total) by mouth daily.   No facility-administered medications prior to visit.    Past Surgical History:  Procedure  Laterality Date   TOOTH EXTRACTION     Family Status  Relation Name Status   Daughter Ryan Morrow Alive   Daughter Ryan Morrow  No partnership data on file   Family History  Problem Relation Age of Onset   ADD / ADHD Daughter    ADD / ADHD Daughter    Social History   Socioeconomic History   Marital status: Single    Spouse name: Not on file   Number of children: Not on file   Years of education: Not on file    Highest education level: GED or equivalent  Occupational History   Not on file  Tobacco Use   Smoking status: Never   Smokeless tobacco: Never   Tobacco comments:    I don't smoke  Vaping Use   Vaping status: Never Used  Substance and Sexual Activity   Alcohol use: No   Drug use: No   Sexual activity: Yes    Birth control/protection: Condom  Other Topics Concern   Not on file  Social History Narrative   Not on file   Social Drivers of Health   Financial Resource Strain: Low Risk  (11/07/2023)   Overall Financial Resource Strain (CARDIA)    Difficulty of Paying Living Expenses: Not hard at all  Food Insecurity: No Food Insecurity (11/07/2023)   Hunger Vital Sign    Worried About Running Out of Food in the Last Year: Never true    Ran Out of Food in the Last Year: Never true  Transportation Needs: No Transportation Needs (11/07/2023)   PRAPARE - Administrator, Civil Service (Medical): No    Lack of Transportation (Non-Medical): No  Physical Activity: Unknown (11/07/2023)   Exercise Vital Sign    Days of Exercise per Week: 0 days    Minutes of Exercise per Session: Not on file  Stress: Stress Concern Present (11/07/2023)   [employer] of Occupational Health - Occupational Stress Questionnaire    Feeling of Stress : Very much  Social Connections: Socially Isolated (11/07/2023)   Social Connection and Isolation Panel [NHANES]    Frequency of Communication with Friends and Family: Once a week    Frequency of Social Gatherings with Friends and Family: Once a week    Attends Religious Services: Never    Database administrator or Organizations: No    Attends Engineer, structural: Not on file    Marital Status: Never married     No Known Allergies   There is no immunization history on file for this patient.  Health Maintenance  Topic Date Due   Hepatitis C Screening  Never done   DTaP/Tdap/Td (1 - Tdap) Never done   INFLUENZA VACCINE  Never done    COVID-19 Vaccine (1 - 2024-25 season) Never done   HIV Screening  Completed   HPV VACCINES  Aged Out    Patient Care Team: Ronnald Ramp, MD as PCP - General (Family Medicine)  Review of Systems  Last CBC Lab Results  Component Value Date   WBC 11.4 (H) 08/14/2022   HGB 15.6 08/14/2022   HCT 49.0 08/14/2022   MCV 83.2 08/14/2022   MCH 26.5 08/14/2022   RDW 14.6 08/14/2022   PLT 346 08/14/2022   Last metabolic panel Lab Results  Component Value Date   GLUCOSE 107 (H) 08/14/2022   NA 140 08/14/2022   K 4.2 08/14/2022   CL 105 08/14/2022   CO2 28 08/14/2022   BUN 11 08/14/2022  CREATININE 0.88 08/14/2022   GFRNONAA >60 08/14/2022   CALCIUM 9.3 08/14/2022   PROT 8.6 (H) 08/14/2022   ALBUMIN 4.8 08/14/2022   BILITOT 1.4 (H) 08/14/2022   ALKPHOS 59 08/14/2022   AST 31 08/14/2022   ALT 33 08/14/2022   ANIONGAP 7 08/14/2022   Last hemoglobin A1c No results found for: "HGBA1C" Last thyroid functions No results found for: "TSH", "T3TOTAL", "T4TOTAL", "THYROIDAB"      Objective    BP 136/80 (Cuff Size: Normal)   Pulse 67   Resp 16   Ht 6' (1.829 m)   Wt 263 lb 1.6 oz (119.3 kg)   SpO2 99%   BMI 35.68 kg/m  BP Readings from Last 3 Encounters:  11/08/23 136/80  08/14/22 (!) 164/97  04/20/20 (!) 183/109   Wt Readings from Last 3 Encounters:  11/08/23 263 lb 1.6 oz (119.3 kg)  08/14/22 247 lb (112 kg)  04/20/20 254 lb (115.2 kg)        Depression Screen     No data to display         No results found for any visits on 11/08/23.   Physical Exam Physical Exam   VITALS: BP- 136/80 MEASUREMENTS: WT- 263 pounds, BMI- 35.68 NECK: Thyroid non-enlarged, no masses palpated CHEST: Breath sounds normal CARDIOVASCULAR: Pulse regular         Assessment & Plan      Problem List Items Addressed This Visit       Cardiovascular and Mediastinum   Primary hypertension - Primary   Chronic hypertension with current BP 139/94 mmHg,  typically higher at home (up to 171/101 mmHg). Managed with amlodipine 5 mg daily. Patient has made dietary changes but continues to struggle with high readings. Family history of heart disease. Discussed risks of uncontrolled hypertension including heart attack and stroke. Explained benefits of adding hydrochlorothiazide for better control. - Continue amlodipine 5 mg daily - Add hydrochlorothiazide 25 mg daily - Advise taking amlodipine in the daytime - Order complete metabolic panel, A1c, TSH, T4, T3, CBC, and lipid panel      Relevant Medications   amLODipine (NORVASC) 5 MG tablet   hydrochlorothiazide (HYDRODIURIL) 25 MG tablet   Other Relevant Orders   CMP14+EGFR   Lipid Profile     Other   Obesity, Class II, BMI 35-39.9   BMI 35.68, weight 263 lbs. Patient interested in weight loss medication. Discussed potential use of Wegovy pending lab results. Explained risks of obesity including diabetes, hypertension, and cardiovascular disease. Discussed benefits of weight loss including improved overall health and reduced comorbidities. - Discuss [id number] pending lab results - Advise on dietary modifications and reducing midnight snacking - Encourage regular physical activity      Relevant Orders   Hemoglobin A1c   CMP14+EGFR   TSH+T4F+T3Free   CBC   Lipid Profile   Family history of early CAD   Relevant Orders   Lipid Profile   Chronic insomnia   Chronic difficulty sleeping, exacerbated by ADHD medication. Patient concerned about taking sleep aids due to need to remain alert for children. Discussed potential use of trazodone or hydroxyzine for sleep. Explained these medications are not habit-forming and can improve sleep quality. Patient to consider and discuss further at next visit. - Discuss potential use of trazodone or hydroxyzine for sleep - Patient to consider and discuss further at next visit      Attention deficit hyperactivity disorder (ADHD), predominantly inattentive type    Chronic ADHD diagnosed in childhood, previously managed  with Adderall. Patient reports difficulty focusing and managing tasks without medication. Prefers to restart Adderall. Discussed risks of stimulant medications including potential for insomnia and increased heart rate. Explained benefits of improved focus and task completion. Discussed need for prior authorization and follow-up to assess efficacy and side effects. - Prescribe Adderall 10 mg twice daily - Discuss potential need for prior authorization - Follow up in one month to assess efficacy and side effects      Relevant Medications   amphetamine-dextroamphetamine (ADDERALL) 10 MG tablet         General Health Maintenance First visit to establish care. Discussed importance of regular check-ups and screenings. Explained benefits of routine health maintenance including early detection of health issues and prevention of disease progression. - Schedule annual physical - Encourage use of MyChart for lab results and communication    Return in about 2 weeks (around 11/22/2023) for HTN.      Ronnald Ramp, MD  [facility] [phone] (phone) [phone] (fax)  [facility]

## 2023-11-08 NOTE — Assessment & Plan Note (Signed)
Chronic hypertension with current BP 139/94 mmHg, typically higher at home (up to 171/101 mmHg). Managed with amlodipine 5 mg daily. Patient has made dietary changes but continues to struggle with high readings. Family history of heart disease. Discussed risks of uncontrolled hypertension including heart attack and stroke. Explained benefits of adding hydrochlorothiazide for better control. - Continue amlodipine 5 mg daily - Add hydrochlorothiazide 25 mg daily - Advise taking amlodipine in the daytime - Order complete metabolic panel, A1c, TSH, T4, T3, CBC, and lipid panel

## 2023-11-08 NOTE — Assessment & Plan Note (Signed)
Chronic difficulty sleeping, exacerbated by ADHD medication. Patient concerned about taking sleep aids due to need to remain alert for children. Discussed potential use of trazodone or hydroxyzine for sleep. Explained these medications are not habit-forming and can improve sleep quality. Patient to consider and discuss further at next visit. - Discuss potential use of trazodone or hydroxyzine for sleep - Patient to consider and discuss further at next visit

## 2023-11-08 NOTE — Assessment & Plan Note (Signed)
BMI 35.68, weight 263 lbs. Patient interested in weight loss medication. Discussed potential use of Wegovy pending lab results. Explained risks of obesity including diabetes, hypertension, and cardiovascular disease. Discussed benefits of weight loss including improved overall health and reduced comorbidities. - Discuss [id number] pending lab results - Advise on dietary modifications and reducing midnight snacking - Encourage regular physical activity

## 2023-11-08 NOTE — Assessment & Plan Note (Signed)
Chronic ADHD diagnosed in childhood, previously managed with Adderall. Patient reports difficulty focusing and managing tasks without medication. Prefers to restart Adderall. Discussed risks of stimulant medications including potential for insomnia and increased heart rate. Explained benefits of improved focus and task completion. Discussed need for prior authorization and follow-up to assess efficacy and side effects. - Prescribe Adderall 10 mg twice daily - Discuss potential need for prior authorization - Follow up in one month to assess efficacy and side effects

## 2023-11-09 ENCOUNTER — Encounter: Payer: Self-pay | Admitting: Family Medicine

## 2023-11-09 ENCOUNTER — Telehealth: Payer: Self-pay | Admitting: Family Medicine

## 2023-11-09 LAB — TSH+T4F+T3FREE
Free T4: 0.98 ng/dL (ref 0.82–1.77)
T3, Free: 3.5 pg/mL (ref 2.0–4.4)
TSH: 2.62 u[IU]/mL (ref 0.450–4.500)

## 2023-11-09 LAB — CMP14+EGFR
ALT: 33 [IU]/L (ref 0–44)
AST: 29 [IU]/L (ref 0–40)
Albumin: 4.6 g/dL (ref 4.1–5.1)
Alkaline Phosphatase: 71 [IU]/L (ref 44–121)
BUN/Creatinine Ratio: 14 (ref 9–20)
BUN: 17 mg/dL (ref 6–20)
Bilirubin Total: 0.4 mg/dL (ref 0.0–1.2)
CO2: 25 mmol/L (ref 20–29)
Calcium: 9.9 mg/dL (ref 8.7–10.2)
Chloride: 99 mmol/L (ref 96–106)
Creatinine, Ser: 1.21 mg/dL (ref 0.76–1.27)
Globulin, Total: 2.7 g/dL (ref 1.5–4.5)
Glucose: 86 mg/dL (ref 70–99)
Potassium: 4.3 mmol/L (ref 3.5–5.2)
Sodium: 140 mmol/L (ref 134–144)
Total Protein: 7.3 g/dL (ref 6.0–8.5)
eGFR: 79 mL/min/{1.73_m2} (ref >59)

## 2023-11-09 LAB — CBC
Hematocrit: 44.3 % (ref 37.5–51.0)
Hemoglobin: 14.5 g/dL (ref 13.0–17.7)
MCH: 27.1 pg (ref 26.6–33.0)
MCHC: 32.7 g/dL (ref 31.5–35.7)
MCV: 83 fL (ref 79–97)
Platelets: 313 10*3/uL (ref 150–450)
RBC: 5.36 x10E6/uL (ref 4.14–5.80)
RDW: 12.6 % (ref 11.6–15.4)
WBC: 9 10*3/uL (ref 3.4–10.8)

## 2023-11-09 LAB — LIPID PANEL
Chol/HDL Ratio: 4.8 {ratio} (ref 0.0–5.0)
Cholesterol, Total: 183 mg/dL (ref 100–199)
HDL: 38 mg/dL — ABNORMAL LOW (ref >39)
LDL Chol Calc (NIH): 125 mg/dL — ABNORMAL HIGH (ref 0–99)
Triglycerides: 109 mg/dL (ref 0–149)
VLDL Cholesterol Cal: 20 mg/dL (ref 5–40)

## 2023-11-09 LAB — HEMOGLOBIN A1C
Est. average glucose Bld gHb Est-mCnc: 114 mg/dL
Hgb A1c MFr Bld: 5.6 % (ref 4.8–5.6)

## 2023-11-09 NOTE — Telephone Encounter (Signed)
Received a fax from covermymeds for Amphetamine-Dextroamphetamine 10mg   Key: [id number]

## 2023-11-10 NOTE — Telephone Encounter (Signed)
Unable to access with this key

## 2023-11-11 NOTE — Telephone Encounter (Signed)
Received another fax for this medication with this same key:  [id number]

## 2023-11-12 NOTE — Telephone Encounter (Signed)
PA key:[id number] DEXTROAMPHETAMINE 10 MGs, this request is approved from 11/12/2023 to 11/11/2026

## 2023-11-22 ENCOUNTER — Encounter: Payer: Self-pay | Admitting: Family Medicine

## 2023-11-22 ENCOUNTER — Ambulatory Visit: Payer: BC Managed Care – PPO | Admitting: Family Medicine

## 2023-11-22 VITALS — BP 126/82 | HR 76 | Resp 18 | Ht 72.0 in | Wt 253.0 lb

## 2023-11-22 DIAGNOSIS — F331 Major depressive disorder, recurrent, moderate: Secondary | ICD-10-CM | POA: Insufficient documentation

## 2023-11-22 DIAGNOSIS — I10 Essential (primary) hypertension: Secondary | ICD-10-CM

## 2023-11-22 DIAGNOSIS — E66812 Obesity, class 2: Secondary | ICD-10-CM | POA: Diagnosis not present

## 2023-11-22 DIAGNOSIS — F900 Attention-deficit hyperactivity disorder, predominantly inattentive type: Secondary | ICD-10-CM | POA: Diagnosis not present

## 2023-11-22 DIAGNOSIS — Z6834 Body mass index (BMI) 34.0-34.9, adult: Secondary | ICD-10-CM

## 2023-11-22 MED ORDER — SERTRALINE HCL 25 MG PO TABS: 25.0000 mg | ORAL_TABLET | Freq: Every day | ORAL | 3 refills | Status: DC

## 2023-11-22 NOTE — Assessment & Plan Note (Signed)
Reports improved energy and focus with Adderall 10 mg twice daily. No adverse effects noted. Chronic, improved on medication regimen - Continue Adderall 10 mg twice daily

## 2023-11-22 NOTE — Progress Notes (Signed)
Established patient visit   Patient: Ryan Morrow   DOB: 1985-12-01   38 y.o. Male  MRN: [id number] Visit Date: 11/22/2023  Today's healthcare provider: Ronnald Ramp, MD   Chief Complaint  Patient presents with   Hypertension   Subjective       Discussed the use of AI scribe software for clinical note transcription with the patient, who gave verbal consent to proceed.  History of Present Illness   The patient is a 38 year old male with hypertension and ADHD who presents for follow-up.  He has hypertension managed with hydrochlorothiazide 25 mg and amlodipine 5 mg daily. His blood pressure has improved, with a recent reading of 126/82 mmHg. He experiences muscle cramping, particularly during physical activity, and has been consuming protein shakes with 580 mg of potassium to address this, but feels it is insufficient.  He is following up for ADHD and is taking Adderall 10 mg twice daily. He notes an improvement in energy levels and focus, particularly during work tasks, but mentions a lack of motivation for personal activities. His mood is described as 'okay', with a decrease in enjoyment from activities he used to find engaging, such as gaming and reading.  He completed a PHQ-9 depression screening with a score of 16, indicating moderate depression. He acknowledges having thoughts of being better off dead but denies any active suicidal ideation or plans. He describes a lack of fear about the prospect of dying and notes a general disinterest in life continuing. He has a history of being diagnosed with both anxiety and depression, having experienced a panic attack at work in the past.  His brother has schizophrenia, and he is the brother's guardian. No auditory or visual hallucinations.        Flowsheet Row Office Visit from 11/22/2023 in [facility]  PHQ-9 Total Score 16       Past Medical History:  Diagnosis Date   ADHD    Anxiety     Depression    Hypertension     Medications: Outpatient Medications Prior to Visit  Medication Sig   amLODipine (NORVASC) 5 MG tablet Take 5 mg by mouth daily.   amphetamine-dextroamphetamine (ADDERALL) 10 MG tablet Take 1 tablet (10 mg total) by mouth 2 (two) times daily.   hydrochlorothiazide (HYDRODIURIL) 25 MG tablet Take 1 tablet (25 mg total) by mouth daily. Take one tablet by mouth once daily   ibuprofen (ADVIL) 800 MG tablet Take by mouth.   No facility-administered medications prior to visit.    Review of Systems  Last CBC Lab Results  Component Value Date   WBC 9.0 11/08/2023   HGB 14.5 11/08/2023   HCT 44.3 11/08/2023   MCV 83 11/08/2023   MCH 27.1 11/08/2023   RDW 12.6 11/08/2023   PLT 313 11/08/2023   Last metabolic panel Lab Results  Component Value Date   GLUCOSE 86 11/08/2023   NA 140 11/08/2023   K 4.3 11/08/2023   CL 99 11/08/2023   CO2 25 11/08/2023   BUN 17 11/08/2023   CREATININE 1.21 11/08/2023   EGFR 79 11/08/2023   CALCIUM 9.9 11/08/2023   PROT 7.3 11/08/2023   ALBUMIN 4.6 11/08/2023   LABGLOB 2.7 11/08/2023   BILITOT 0.4 11/08/2023   ALKPHOS 71 11/08/2023   AST 29 11/08/2023   ALT 33 11/08/2023   ANIONGAP 7 08/14/2022   Last lipids Lab Results  Component Value Date   CHOL 183 11/08/2023  HDL 38 (L) 11/08/2023   LDLCALC 125 (H) 11/08/2023   TRIG 109 11/08/2023   CHOLHDL 4.8 11/08/2023   Last hemoglobin A1c Lab Results  Component Value Date   HGBA1C 5.6 11/08/2023   Last thyroid functions Lab Results  Component Value Date   TSH 2.620 11/08/2023        Objective    BP 126/82   Pulse 76   Resp 18   Ht 6' (1.829 m)   Wt 253 lb (114.8 kg)   SpO2 98%   BMI 34.31 kg/m   BP Readings from Last 3 Encounters:  11/22/23 126/82  11/08/23 136/80  08/14/22 (!) 164/97   Wt Readings from Last 3 Encounters:  11/22/23 253 lb (114.8 kg)  11/08/23 263 lb 1.6 oz (119.3 kg)  08/14/22 247 lb (112 kg)        Physical  Exam Vitals reviewed.  Constitutional:      General: He is not in acute distress.    Appearance: Normal appearance. He is not ill-appearing, toxic-appearing or diaphoretic.  Eyes:     Conjunctiva/sclera: Conjunctivae normal.  Cardiovascular:     Rate and Rhythm: Normal rate and regular rhythm.     Pulses: Normal pulses.     Heart sounds: Normal heart sounds. No murmur heard.    No friction rub. No gallop.  Pulmonary:     Effort: Pulmonary effort is normal. No respiratory distress.     Breath sounds: Normal breath sounds. No stridor. No wheezing, rhonchi or rales.  Abdominal:     General: Bowel sounds are normal. There is no distension.     Palpations: Abdomen is soft.     Tenderness: There is no abdominal tenderness.  Musculoskeletal:     Right lower leg: No edema.     Left lower leg: No edema.  Skin:    Findings: No erythema or rash.  Neurological:     Mental Status: He is alert and oriented to person, place, and time.  Psychiatric:        Attention and Perception: Attention normal.        Mood and Affect: Mood normal. Affect is blunt. Affect is not tearful.        Speech: Speech normal.        Behavior: Behavior normal. Behavior is cooperative.        Thought Content: Thought content is not paranoid or delusional.       No results found for any visits on 11/22/23.  Assessment & Plan     Problem List Items Addressed This Visit       Cardiovascular and Mediastinum   Primary hypertension - Primary   Chronic hypertension, currently well-controlled with a blood pressure reading of 126/82 mmHg. Reports muscle cramping, likely due to hydrochlorothiazide. Discussed reducing hydrochlorothiazide to mitigate cramping and the need to monitor potassium levels and kidney function. - Reduce hydrochlorothiazide to 12.5 mg daily, continue amlodipine 5mg  daily - Check blood potassium levels - Check kidney function      Relevant Orders   BMP8+EGFR     Other   Obesity, Class II,  BMI 35-39.9   Moderate episode of recurrent major depressive disorder (HCC)   Persistent depressive symptoms with a PHQ-9 score of 16 and passive suicidal ideation. Symptoms include anhedonia and difficulty concentrating. Discussed starting sertraline for mood improvement and referral to psychiatry for further management. Informed that psychiatric treatment will not affect CCW permit unless hospitalized. Chronic, symptoms not well controlled  - Refer to  psychiatry for medication management and cognitive behavioral therapy - Start sertraline 25 mg once daily - Schedule virtual follow-up in 2-3 weeks to monitor mood and blood pressure      Relevant Medications   sertraline (ZOLOFT) 25 MG tablet   Other Relevant Orders   Ambulatory referral to Psychiatry   Attention deficit hyperactivity disorder (ADHD), predominantly inattentive type   Reports improved energy and focus with Adderall 10 mg twice daily. No adverse effects noted. Chronic, improved on medication regimen - Continue Adderall 10 mg twice daily             General Health Maintenance Reassured that psychiatric treatment will not affect CCW permit unless hospitalized. - Provide reassurance regarding CCW permit and psychiatric treatment   Return in about 2 weeks (around 12/06/2023) for Depression, HTN.         Ronnald Ramp, MD  [facility] [phone] (phone) [phone] (fax)  [facility]

## 2023-11-22 NOTE — Assessment & Plan Note (Signed)
Chronic hypertension, currently well-controlled with a blood pressure reading of 126/82 mmHg. Reports muscle cramping, likely due to hydrochlorothiazide. Discussed reducing hydrochlorothiazide to mitigate cramping and the need to monitor potassium levels and kidney function. - Reduce hydrochlorothiazide to 12.5 mg daily, continue amlodipine 5mg  daily - Check blood potassium levels - Check kidney function

## 2023-11-22 NOTE — Assessment & Plan Note (Signed)
Persistent depressive symptoms with a PHQ-9 score of 16 and passive suicidal ideation. Symptoms include anhedonia and difficulty concentrating. Discussed starting sertraline for mood improvement and referral to psychiatry for further management. Informed that psychiatric treatment will not affect CCW permit unless hospitalized. Chronic, symptoms not well controlled  - Refer to psychiatry for medication management and cognitive behavioral therapy - Start sertraline 25 mg once daily - Schedule virtual follow-up in 2-3 weeks to monitor mood and blood pressure

## 2023-11-23 ENCOUNTER — Encounter: Payer: Self-pay | Admitting: Family Medicine

## 2023-11-23 LAB — BMP8+EGFR
BUN/Creatinine Ratio: 9 (ref 9–20)
BUN: 10 mg/dL (ref 6–20)
CO2: 26 mmol/L (ref 20–29)
Calcium: 9.5 mg/dL (ref 8.7–10.2)
Chloride: 102 mmol/L (ref 96–106)
Creatinine, Ser: 1.09 mg/dL (ref 0.76–1.27)
Glucose: 107 mg/dL — ABNORMAL HIGH (ref 70–99)
Potassium: 4 mmol/L (ref 3.5–5.2)
Sodium: 143 mmol/L (ref 134–144)
eGFR: 89 mL/min/{1.73_m2} (ref >59)

## 2023-12-07 ENCOUNTER — Telehealth: Payer: BC Managed Care – PPO | Admitting: Family Medicine

## 2023-12-07 ENCOUNTER — Encounter: Payer: Self-pay | Admitting: Family Medicine

## 2023-12-07 VITALS — BP 137/97

## 2023-12-07 DIAGNOSIS — I10 Essential (primary) hypertension: Secondary | ICD-10-CM

## 2023-12-07 DIAGNOSIS — F900 Attention-deficit hyperactivity disorder, predominantly inattentive type: Secondary | ICD-10-CM

## 2023-12-07 DIAGNOSIS — F902 Attention-deficit hyperactivity disorder, combined type: Secondary | ICD-10-CM

## 2023-12-07 DIAGNOSIS — F5104 Psychophysiologic insomnia: Secondary | ICD-10-CM | POA: Diagnosis not present

## 2023-12-07 DIAGNOSIS — F331 Major depressive disorder, recurrent, moderate: Secondary | ICD-10-CM | POA: Diagnosis not present

## 2023-12-07 MED ORDER — SERTRALINE HCL 25 MG PO TABS: 25.0000 mg | ORAL_TABLET | Freq: Every day | ORAL | 3 refills | Status: DC

## 2023-12-07 MED ORDER — AMLODIPINE BESYLATE 10 MG PO TABS: 10.0000 mg | ORAL_TABLET | Freq: Every day | ORAL | 1 refills | Status: DC

## 2023-12-07 MED ORDER — AMPHETAMINE-DEXTROAMPHETAMINE 10 MG PO TABS: 10.0000 mg | ORAL_TABLET | Freq: Two times a day (BID) | ORAL | 0 refills | Status: DC

## 2023-12-07 NOTE — Patient Instructions (Signed)
   YOUR PLAN:  -HYPERTENSION: Hypertension means high blood pressure. Your blood pressure readings have been elevated, possibly due to diet and stress. We are increasing your amlodipine dose to 10 mg daily and continuing hydrochlorothiazide 25 mg daily. Please monitor for any swelling; if it occurs, reduce amlodipine back to 5 mg. We will check your blood pressure again in two weeks.  -DEPRESSION: Depression is a mood disorder that causes persistent feelings of sadness and loss of interest. You have not started taking sertraline due to a pharmacy issue. We will [facility] [place]. Please start taking sertraline 25 mg daily as soon as you receive it. We will follow up in two weeks to see how you are responding to the medication.   INSTRUCTIONS:  Please schedule a follow-up appointment on March 4th at 8 AM. We will check your blood pressure and assess your response to the new medication for depression.

## 2023-12-07 NOTE — Assessment & Plan Note (Signed)
 Chronic, symptoms well controlled on regimen  Currently on Adderall 10 mg twice daily, which is effective for focus and productivity. Prescription needs refill. - Send prescription for [facility] 10 mg twice [facility] [place]

## 2023-12-07 NOTE — Assessment & Plan Note (Signed)
 Has not started sertraline 25 mg daily due to possible pharmacy communication issue. Symptoms remain unchanged. Prefers medication over therapy. Discussed benefits of combining medication with therapy for better outcomes. Chronic, symptoms are not well controlled  - [facility] prescription [facility] [place] - Follow up in two weeks to assess response to medication

## 2023-12-07 NOTE — Assessment & Plan Note (Addendum)
 Blood pressure readings are elevated (137/97 mmHg in the morning, 155/101 mmHg during the visit). Currently on amlodipine 5 mg and hydrochlorothiazide 25 mg daily. Suspected dietary factors and stress may be contributing. Chronic, BP elevated above goal of less than 130/80 - Increase amlodipine to 10 mg daily - Continue hydrochlorothiazide 25 mg daily - Monitor for signs of edema; if edema occurs, reduce amlodipine to 5 mg - Follow up in two weeks for in-office blood pressure check

## 2023-12-07 NOTE — Progress Notes (Signed)
 MyChart Video Visit    Virtual Visit via Video Note   This format is felt to be most appropriate for this patient at this time. Physical exam was limited by quality of the video and audio technology used for the visit.   Patient location: Patient's home address   Provider location: [facility]  [address], [address]  [place], [place] [address]   I discussed the limitations of evaluation and management by telemedicine and the availability of in person appointments. The patient expressed understanding and agreed to proceed.  Patient: Ryan Morrow   DOB: 1985/11/18   38 y.o. Male  MRN: [id number] Visit Date: 12/07/2023  Today's healthcare provider: Ronnald Ramp, MD   Chief Complaint  Patient presents with   Depression   ADHD   Hypertension   Subjective    HPI   Discussed the use of AI scribe software for clinical note transcription with the patient, who gave verbal consent to proceed.  History of Present Illness   Ryan Morrow is a 38 year old male with depression and hypertension who presents for a follow-up visit.  He has not started taking sertraline due to not receiving the prescription information. [facility] for insurance reasons, which may have contributed to the confusion. He is willing to start the medication once he receives it. His depression symptoms have not improved and remain the same.  His blood pressure reading was 137/97 mmHg in the morning before taking his medication. He is currently taking amlodipine 5 mg and hydrochlorothiazide 25 mg daily. He has only two pills of amlodipine left. He attributes a recent increase in blood pressure to dietary choices over the weekend, specifically mentioning eating at a new barbecue place. He questions the reliability of his blood pressure machine after a reading of 155/101 mmHg was taken during the visit.  He is currently taking Adderall 10 mg twice daily, which  helps him focus and get things done. He obtained the Adderall from [facility] and is due for a refill. He [facility]  His children have a habit of waking up and using their phones instead of getting ready, which causes him stress in the mornings. He mentions that this stress could have contributed to his elevated blood pressure reading.          Past Medical History:  Diagnosis Date   ADHD    Anxiety    Depression    Hypertension     Medications: Outpatient Medications Prior to Visit  Medication Sig   hydrochlorothiazide (HYDRODIURIL) 25 MG tablet Take 1 tablet (25 mg total) by mouth daily. Take one tablet by mouth once daily   ibuprofen (ADVIL) 800 MG tablet Take by mouth.   [DISCONTINUED] amLODipine (NORVASC) 5 MG tablet Take 5 mg by mouth daily.   [DISCONTINUED] amphetamine-dextroamphetamine (ADDERALL) 10 MG tablet Take 1 tablet (10 mg total) by mouth 2 (two) times daily.   [DISCONTINUED] sertraline (ZOLOFT) 25 MG tablet Take 1 tablet (25 mg total) by mouth daily.   No facility-administered medications prior to visit.    Review of Systems      Objective    BP (!) 137/97 (Cuff Size: Normal)   BP Readings from Last 3 Encounters:  12/07/23 (!) 137/97  11/22/23 126/82  11/08/23 136/80   Wt Readings from Last 3 Encounters:  11/22/23 253 lb (114.8 kg)  11/08/23 263 lb 1.6 oz (119.3 kg)  08/14/22 247 lb (112  kg)        Physical Exam Constitutional:      General: He is not in acute distress.    Appearance: Normal appearance. He is not ill-appearing.  Pulmonary:     Effort: Pulmonary effort is normal. No respiratory distress.  Neurological:     Mental Status: He is alert and oriented to person, place, and time.        Assessment & Plan     Problem List Items Addressed This Visit       Cardiovascular and Mediastinum   Primary hypertension   Blood pressure readings are elevated (137/97 mmHg in the morning, 155/101 mmHg  during the visit). Currently on amlodipine 5 mg and hydrochlorothiazide 25 mg daily. Suspected dietary factors and stress may be contributing. Chronic, BP elevated above goal of less than 130/80 - Increase amlodipine to 10 mg daily - Continue hydrochlorothiazide 25 mg daily - Monitor for signs of edema; if edema occurs, reduce amlodipine to 5 mg - Follow up in two weeks for in-office blood pressure check      Relevant Medications   amLODipine (NORVASC) 10 MG tablet     Other   Moderate episode of recurrent major depressive disorder (HCC) - Primary   Has not started sertraline 25 mg daily due to possible pharmacy communication issue. Symptoms remain unchanged. Prefers medication over therapy. Discussed benefits of combining medication with therapy for better outcomes. Chronic, symptoms are not well controlled  - [facility] [place] - Follow up in two weeks to assess response to medication      Relevant Medications   sertraline (ZOLOFT) 25 MG tablet   Chronic insomnia   Attention deficit hyperactivity disorder (ADHD), predominantly inattentive type   Chronic, symptoms well controlled on regimen  Currently on Adderall 10 mg twice daily, which is effective for focus and productivity. Prescription needs refill. - [facility] [place]      Relevant Medications   amphetamine-dextroamphetamine (ADDERALL) 10 MG tablet   Other Visit Diagnoses       Attention deficit hyperactivity disorder (ADHD), combined type       Relevant Medications   amphetamine-dextroamphetamine (ADDERALL) 10 MG tablet        No follow-ups on file.     I discussed the assessment and treatment plan with the patient. The patient was provided an opportunity to ask questions and all were answered. The patient agreed with the plan and demonstrated an understanding of the instructions.   The patient was advised to call back or  seek an in-person evaluation if the symptoms worsen or if the condition fails to improve as anticipated.  I provided 15 minutes of non-face-to-face time during this encounter.   Ronnald Ramp, MD [facility] [phone] (phone) [phone] (fax)  [facility]

## 2023-12-21 ENCOUNTER — Ambulatory Visit: Payer: BC Managed Care – PPO | Admitting: Family Medicine

## 2023-12-21 ENCOUNTER — Encounter: Payer: Self-pay | Admitting: Family Medicine

## 2023-12-21 VITALS — BP 114/78 | HR 73 | Ht 72.0 in | Wt 243.0 lb

## 2023-12-21 DIAGNOSIS — Z23 Encounter for immunization: Secondary | ICD-10-CM | POA: Diagnosis not present

## 2023-12-21 DIAGNOSIS — I10 Essential (primary) hypertension: Secondary | ICD-10-CM

## 2023-12-21 DIAGNOSIS — F331 Major depressive disorder, recurrent, moderate: Secondary | ICD-10-CM

## 2023-12-21 MED ORDER — SERTRALINE HCL 50 MG PO TABS: 50.0000 mg | ORAL_TABLET | Freq: Every day | ORAL | 1 refills | Status: DC

## 2023-12-21 NOTE — Progress Notes (Addendum)
 Established patient visit   Patient: Ryan Morrow   DOB: 1986-02-26   38 y.o. Male  MRN: [id number] Visit Date: 12/21/2023  Today's healthcare provider: Ronnald Ramp, MD   Chief Complaint  Patient presents with   Hypertension    Bp recheck, no concerns, its still been running on the high side    Subjective     HPI     Hypertension    Additional comments: Bp recheck, no concerns, its still been running on the high side       Last edited by Thedora Hinders, CMA on 12/21/2023  8:05 AM.       Discussed the use of AI scribe software for clinical note transcription with the patient, who gave verbal consent to proceed.  History of Present Illness   Ryan Morrow is a 38 year old male with hypertension and depression who presents for a follow-up visit.  He has chronic hypertension, which is well controlled with a blood pressure reading of 114/78 mmHg. He continues to take amlodipine 10 mg and hydrochlorothiazide 25 mg daily. He experiences dry mouth, which he attributes to hydrochlorothiazide, but tolerates it due to effective blood pressure control.  Regarding depression, he continues to take sertraline 25 mg daily. He finds the medication effective, as he is experiencing new emotions, which he finds slightly overwhelming. He describes difficulty in processing emotions and letting go of feelings, which is unusual for him. He has not yet established care with a therapist or counselor, as he wanted to see if the sertraline would help with his mood. No thoughts of self-harm or wanting to be dead. He mentions behavioral changes, such as no longer sitting in the dark and wearing brighter colors, indicating a positive change in his mood.         Past Medical History:  Diagnosis Date   ADHD    Anxiety    Depression    Hypertension     Medications: Outpatient Medications Prior to Visit  Medication Sig   amLODipine (NORVASC) 10 MG tablet Take 1 tablet (10 mg  total) by mouth daily.   amphetamine-dextroamphetamine (ADDERALL) 10 MG tablet Take 1 tablet (10 mg total) by mouth 2 (two) times daily.   hydrochlorothiazide (HYDRODIURIL) 25 MG tablet Take 1 tablet (25 mg total) by mouth daily. Take one tablet by mouth once daily   ibuprofen (ADVIL) 800 MG tablet Take by mouth.   [DISCONTINUED] sertraline (ZOLOFT) 25 MG tablet Take 1 tablet (25 mg total) by mouth daily.   No facility-administered medications prior to visit.    Review of Systems  Last metabolic panel Lab Results  Component Value Date   GLUCOSE 107 (H) 11/22/2023   NA 143 11/22/2023   K 4.0 11/22/2023   CL 102 11/22/2023   CO2 26 11/22/2023   BUN 10 11/22/2023   CREATININE 1.09 11/22/2023   EGFR 89 11/22/2023   CALCIUM 9.5 11/22/2023   PROT 7.3 11/08/2023   ALBUMIN 4.6 11/08/2023   LABGLOB 2.7 11/08/2023   BILITOT 0.4 11/08/2023   ALKPHOS 71 11/08/2023   AST 29 11/08/2023   ALT 33 11/08/2023   ANIONGAP 7 08/14/2022        Objective    BP 114/78   Pulse 73   Ht 6' (1.829 m)   Wt 243 lb (110.2 kg)   SpO2 100%   BMI 32.96 kg/m  BP Readings from Last 3 Encounters:  12/21/23 114/78  12/07/23 (!) 137/97  11/22/23 126/82  Wt Readings from Last 3 Encounters:  12/21/23 243 lb (110.2 kg)  11/22/23 253 lb (114.8 kg)  11/08/23 263 lb 1.6 oz (119.3 kg)        Physical Exam Psychiatric:        Attention and Perception: Attention normal.        Mood and Affect: Mood normal.        Speech: Speech normal.        Behavior: Behavior normal. Behavior is cooperative.        Thought Content: Thought content does not include suicidal ideation. Thought content does not include suicidal plan.     Physical Exam   VITALS: BP- 114/79 CHEST: Lungs clear to auscultation, no wheezing, no crackles. CARDIOVASCULAR: RRR      No results found for any visits on 12/21/23.  Assessment & Plan     Problem List Items Addressed This Visit       Cardiovascular and Mediastinum    Primary hypertension   Chronic hypertension, well-controlled with a blood pressure of 114/78 mmHg. He is on a stable regimen of amlodipine and hydrochlorothiazide, effectively managing his blood pressure. He reports dry mouth, a known side effect of hydrochlorothiazide, but is willing to tolerate it for the benefit of blood pressure control. - Continue amlodipine 10 mg daily - Continue hydrochlorothiazide 25 mg daily - Monitor for dry mouth; consider reducing hydrochlorothiazide if symptoms persist - Consider Biotene mouthwash for dry mouth        Other   Moderate episode of recurrent major depressive disorder (HCC) - Primary   Chronic depression, currently managed with sertraline 25 mg daily. He reports mood improvement but experiences new and overwhelming emotions. He acknowledges the medication's effectiveness in preventing emotional suppression. No suicidal ideation reported. He is open to increasing the dose of sertraline to further stabilize mood. - Increase sertraline to 50 mg daily - Monitor mood and emotional response - Consider referral to a therapist for additional support      Relevant Medications   sertraline (ZOLOFT) 50 MG tablet   Other Visit Diagnoses       Need for Tdap vaccination       Relevant Orders   Tdap vaccine greater than or equal to 7yo IM            General Health Maintenance Due for a tetanus vaccination. Discussed common side effect of soreness at the injection site and advised on arm movement to reduce discomfort. - Administered tetanus vaccine      Return in about 4 weeks (around 01/18/2024) for Depression.         Ronnald Ramp, MD  [facility] [phone] (phone) [phone] (fax)  [facility]

## 2023-12-21 NOTE — Assessment & Plan Note (Signed)
 Chronic depression, currently managed with sertraline 25 mg daily. He reports mood improvement but experiences new and overwhelming emotions. He acknowledges the medication's effectiveness in preventing emotional suppression. No suicidal ideation reported. He is open to increasing the dose of sertraline to further stabilize mood. - Increase sertraline to 50 mg daily - Monitor mood and emotional response - Consider referral to a therapist for additional support

## 2023-12-21 NOTE — Patient Instructions (Signed)
 [email or web address]

## 2023-12-21 NOTE — Addendum Note (Signed)
 Addended by: Bing Neighbors on: 12/21/2023 01:55 PM   Modules accepted: Orders

## 2023-12-21 NOTE — Assessment & Plan Note (Signed)
 Chronic hypertension, well-controlled with a blood pressure of 114/78 mmHg. He is on a stable regimen of amlodipine and hydrochlorothiazide, effectively managing his blood pressure. He reports dry mouth, a known side effect of hydrochlorothiazide, but is willing to tolerate it for the benefit of blood pressure control. - Continue amlodipine 10 mg daily - Continue hydrochlorothiazide 25 mg daily - Monitor for dry mouth; consider reducing hydrochlorothiazide if symptoms persist - Consider Biotene mouthwash for dry mouth

## 2024-01-05 ENCOUNTER — Other Ambulatory Visit: Payer: Self-pay | Admitting: Family Medicine

## 2024-01-05 DIAGNOSIS — F902 Attention-deficit hyperactivity disorder, combined type: Secondary | ICD-10-CM

## 2024-01-05 MED ORDER — AMPHETAMINE-DEXTROAMPHETAMINE 10 MG PO TABS: 10.0000 mg | ORAL_TABLET | Freq: Two times a day (BID) | ORAL | 0 refills | Status: DC

## 2024-01-05 NOTE — Telephone Encounter (Signed)
 LOV 3*4*25 NOV 4*4*25 LRF [id number] LABS [id number]

## 2024-01-21 ENCOUNTER — Telehealth: Admitting: Family Medicine

## 2024-02-08 ENCOUNTER — Other Ambulatory Visit: Payer: Self-pay | Admitting: Family Medicine

## 2024-02-08 DIAGNOSIS — F902 Attention-deficit hyperactivity disorder, combined type: Secondary | ICD-10-CM

## 2024-02-08 NOTE — Telephone Encounter (Signed)
 Copied from CRM [id number]. Topic: Clinical - Medication Refill >> Feb 08, 2024  8:26 AM Rosaria Common wrote: Most Recent Primary Care Visit:  Provider: Mimi Alt  Department: [facility]  Visit Type: OFFICE VISIT  Date: 12/21/2023  Medication: amphetamine -dextroamphetamine  (ADDERALL) 10 MG tablet  Has the patient contacted their pharmacy? No (Agent: If no, request that the patient contact the pharmacy for the refill. If patient does not wish to contact the pharmacy document the reason why and proceed with request.) (Agent: If yes, when and what did the pharmacy advise?)  Is this the correct pharmacy for this prescription? Yes If no, delete pharmacy and type the correct one.  This is the patient's preferred pharmacy:  [id number] [place] - [address] [place] [address] Phone: [phone] Fax: [phone]   Has the prescription been filled recently? No  Is the patient out of the medication? No  Has the patient been seen for an appointment in the last year OR does the patient have an upcoming appointment? Yes  Can we respond through MyChart? Yes  Agent: Please be advised that Rx refills may take up to 3 business days. We ask that you follow-up with your pharmacy.

## 2024-02-09 MED ORDER — AMPHETAMINE-DEXTROAMPHETAMINE 10 MG PO TABS: 10.0000 mg | ORAL_TABLET | Freq: Two times a day (BID) | ORAL | 0 refills | Status: DC

## 2024-02-09 NOTE — Telephone Encounter (Signed)
 Requested medication (s) are due for refill today -no  Requested medication (s) are on the active medication list -yes  Future visit scheduled -no  Last refill: 02/09/24 #60  Notes to clinic: non delegated Rx, duplicate request-filled today  Requested Prescriptions  Pending Prescriptions Disp Refills   amphetamine -dextroamphetamine  (ADDERALL) 10 MG tablet 60 tablet 0    Sig: Take 1 tablet (10 mg total) by mouth 2 (two) times daily.     Not Delegated - Psychiatry:  Stimulants/ADHD Failed - 02/09/2024  8:52 AM      Failed - This refill cannot be delegated      Failed - Urine Drug Screen completed in last 360 days      Passed - Last BP in normal range    BP Readings from Last 1 Encounters:  12/21/23 114/78         Passed - Last Heart Rate in normal range    Pulse Readings from Last 1 Encounters:  12/21/23 73         Passed - Valid encounter within last 6 months    Recent Outpatient Visits           1 month ago Moderate episode of recurrent major depressive disorder (HCC)   [place] [facility] Simmons-Robinson, [place], MD   2 months ago Moderate episode of recurrent major depressive disorder (HCC)   [place] [facility] Simmons-Robinson, [place], MD   2 months ago Primary hypertension   [place] [facility] [place], [place], MD                 Requested Prescriptions  Pending Prescriptions Disp Refills   amphetamine -dextroamphetamine  (ADDERALL) 10 MG tablet 60 tablet 0    Sig: Take 1 tablet (10 mg total) by mouth 2 (two) times daily.     Not Delegated - Psychiatry:  Stimulants/ADHD Failed - 02/09/2024  8:52 AM      Failed - This refill cannot be delegated      Failed - Urine Drug Screen completed in last 360 days      Passed - Last BP in normal range    BP Readings from Last 1 Encounters:  12/21/23 114/78         Passed - Last Heart Rate in normal range    Pulse Readings from Last 1  Encounters:  12/21/23 73         Passed - Valid encounter within last 6 months    Recent Outpatient Visits           1 month ago Moderate episode of recurrent major depressive disorder (HCC)   [place] [facility] Simmons-Robinson, [place], MD   2 months ago Moderate episode of recurrent major depressive disorder (HCC)   [place] [facility] Simmons-Robinson, [place], MD   2 months ago Primary hypertension   [place] [facility] [place], Judyann Number, MD

## 2024-02-11 ENCOUNTER — Ambulatory Visit: Payer: Self-pay

## 2024-02-11 NOTE — Telephone Encounter (Signed)
 Attempted to contact the pt but was only able to leave a voice message asking him to contact the office back in regards to his medication increase request.

## 2024-02-11 NOTE — Telephone Encounter (Signed)
Please see the message below and advise.

## 2024-02-11 NOTE — Telephone Encounter (Signed)
 Please confirm that pt has been taking 10mg  adderall twice per day.   If he has been adhering to regimen and still having difficulty with concentration, I recommend scheduling an office visit to discuss prior to changing dose.   Please assist patient with scheduling OV with myself or next available provider

## 2024-02-11 NOTE — Telephone Encounter (Signed)
 Copied from CRM [id number]. Topic: Clinical - Medication Question >> Feb 11, 2024  1:48 PM Everette C wrote: Reason for CRM: The patient would like to be contacted by a member of clinical staff to discuss a potential increase of their prescription for amphetamine -dextroamphetamine  (ADDERALL) 10 MG tablet [[id number]]  Please contact if/when possible  Chief Complaint: Would like adderall increased Symptoms: states medication is not working as it should. Frequency: na Pertinent Negatives: Patient denies any issues, just states he used to be on 20 mg in highschool  Disposition: [] ED /[] Urgent Care (no appt availability in office) / [] Appointment(In office/virtual)/ []  [facility]/ [x] Home Care/ [] Refused Recommended Disposition /[] [facility]/ []  Follow-up with PCP Additional Notes: Office to call patient back re: Adderall dose.   Reason for Disposition  [1] Prescription prescribed recently is not at pharmacy AND [2] triager has access to patient's EMR AND [3] prescription is recorded in the EMR  Answer Assessment - Initial Assessment Questions 1. NAME of MEDICINE: "What medicine(s) are you calling about?"     Adderall 10 mg 2. QUESTION: "What is your question?" (e.g., double dose of medicine, side effect)     Would like to take 20 mg like he did in highschool or 30 mg. 3. PRESCRIBER: "Who prescribed the medicine?" Reason: if prescribed by specialist, call should be referred to that group.     PCP 4. SYMPTOMS: "Do you have any symptoms?" If Yes, ask: "What symptoms are you having?"  "How bad are the symptoms (e.g., mild, moderate, severe)     States medication is not working as it should anymore. 5. PREGNANCY:  "Is there any chance that you are pregnant?" "When was your last menstrual period?"     na  Protocols used: Medication Question Call-A-AH

## 2024-02-11 NOTE — Telephone Encounter (Signed)
 Called and spoke to the he stated he has been taking the medication twice daily, as advised he has been scheduled to come in and discuss the possible increase

## 2024-02-15 ENCOUNTER — Encounter: Payer: Self-pay | Admitting: Family Medicine

## 2024-02-15 ENCOUNTER — Ambulatory Visit: Admitting: Family Medicine

## 2024-02-15 VITALS — BP 129/81 | HR 87 | Ht 72.0 in | Wt 239.0 lb

## 2024-02-15 DIAGNOSIS — F331 Major depressive disorder, recurrent, moderate: Secondary | ICD-10-CM

## 2024-02-15 DIAGNOSIS — F900 Attention-deficit hyperactivity disorder, predominantly inattentive type: Secondary | ICD-10-CM

## 2024-02-15 MED ORDER — AMPHETAMINE-DEXTROAMPHETAMINE 20 MG PO TABS: 20.0000 mg | ORAL_TABLET | Freq: Two times a day (BID) | ORAL | 0 refills | Status: DC

## 2024-02-15 NOTE — Assessment & Plan Note (Signed)
 Depression is well-controlled on current Zoloft  50 mg daily. He reports significant improvement in mood stability and emotional regulation, with positive feedback from family. - Continue Zoloft  50 mg daily

## 2024-02-15 NOTE — Progress Notes (Signed)
 Established patient visit   Patient: Ryan Morrow   DOB: 1986/08/09   39 y.o. Male  MRN: [id number] Visit Date: 02/15/2024  Today's healthcare provider: Mimi Alt, MD   Chief Complaint  Patient presents with   Medication Dose Change    Discuss medication increase    Subjective     HPI     Medication Dose Change    Additional comments: Discuss medication increase       Last edited by Bart Lieu, CMA on 02/15/2024 11:05 AM.       Discussed the use of AI scribe software for clinical note transcription with the patient, who gave verbal consent to proceed.  History of Present Illness Ryan Morrow is a 38 year old male with ADHD who presents for a follow-up regarding his medication management.  He is currently taking Adderall 10 mg twice a day for ADHD. He experiences decreased energy and focus, indicating that the current dosage is no longer effective. His erratic work schedule complicates consistent medication coverage throughout the day. He typically takes his doses at 7 AM and 12 PM, but by 5 PM, he feels a 'slump' and resorts to caffeine to maintain focus until the next dose. He is requesting an increase in his dosage to 20 mg twice a day.  He is also taking Zoloft  50 mg daily, which has been very effective. He notes significant improvement in emotional regulation, stating he is 'slower to anger' and experiences more clarity in his reactions. His children have noticed a positive change in his behavior, as he no longer yells and feels more in control of his emotions.     Past Medical History:  Diagnosis Date   ADHD    Anxiety    Depression    Hypertension     Medications: Outpatient Medications Prior to Visit  Medication Sig   amLODipine  (NORVASC ) 10 MG tablet Take 1 tablet (10 mg total) by mouth daily.   ibuprofen  (ADVIL ) 800 MG tablet Take by mouth.   sertraline  (ZOLOFT ) 50 MG tablet Take 1 tablet (50 mg total) by mouth daily.    [DISCONTINUED] amphetamine -dextroamphetamine  (ADDERALL) 10 MG tablet Take 1 tablet (10 mg total) by mouth 2 (two) times daily.   hydrochlorothiazide  (HYDRODIURIL ) 25 MG tablet Take 1 tablet (25 mg total) by mouth daily. Take one tablet by mouth once daily   No facility-administered medications prior to visit.    Review of Systems  Last metabolic panel Lab Results  Component Value Date   GLUCOSE 107 (H) 11/22/2023   NA 143 11/22/2023   K 4.0 11/22/2023   CL 102 11/22/2023   CO2 26 11/22/2023   BUN 10 11/22/2023   CREATININE 1.09 11/22/2023   EGFR 89 11/22/2023   CALCIUM 9.5 11/22/2023   PROT 7.3 11/08/2023   ALBUMIN 4.6 11/08/2023   LABGLOB 2.7 11/08/2023   BILITOT 0.4 11/08/2023   ALKPHOS 71 11/08/2023   AST 29 11/08/2023   ALT 33 11/08/2023   ANIONGAP 7 08/14/2022   Last lipids Lab Results  Component Value Date   CHOL 183 11/08/2023   HDL 38 (L) 11/08/2023   LDLCALC 125 (H) 11/08/2023   TRIG 109 11/08/2023   CHOLHDL 4.8 11/08/2023   Last hemoglobin A1c Lab Results  Component Value Date   HGBA1C 5.6 11/08/2023   Last thyroid  functions Lab Results  Component Value Date   TSH 2.620 11/08/2023        Objective    BP 129/81  Pulse 87   Ht 6' (1.829 m)   Wt 239 lb (108.4 kg)   SpO2 100%   BMI 32.41 kg/m  BP Readings from Last 3 Encounters:  02/15/24 129/81  12/21/23 114/78  12/07/23 (!) 137/97   Wt Readings from Last 3 Encounters:  02/15/24 239 lb (108.4 kg)  12/21/23 243 lb (110.2 kg)  11/22/23 253 lb (114.8 kg)        Physical Exam Vitals reviewed.  Constitutional:      General: He is not in acute distress.    Appearance: Normal appearance. He is not ill-appearing, toxic-appearing or diaphoretic.  Eyes:     Conjunctiva/sclera: Conjunctivae normal.  Cardiovascular:     Rate and Rhythm: Normal rate and regular rhythm.     Pulses: Normal pulses.     Heart sounds: Normal heart sounds. No murmur heard.    No friction rub. No gallop.   Pulmonary:     Effort: Pulmonary effort is normal. No respiratory distress.     Breath sounds: Normal breath sounds. No stridor. No wheezing, rhonchi or rales.  Abdominal:     General: Bowel sounds are normal. There is no distension.     Palpations: Abdomen is soft.     Tenderness: There is no abdominal tenderness.  Musculoskeletal:     Right lower leg: No edema.     Left lower leg: No edema.  Skin:    Findings: No erythema or rash.  Neurological:     Mental Status: He is alert and oriented to person, place, and time.  Psychiatric:        Mood and Affect: Mood and affect normal. Mood is not anxious or depressed. Affect is not flat or tearful.        Speech: Speech normal.        Behavior: Behavior normal. Behavior is cooperative.       No results found for any visits on 02/15/24.  Assessment & Plan     Problem List Items Addressed This Visit       Other   Moderate episode of recurrent major depressive disorder (HCC)   Depression is well-controlled on current Zoloft  50 mg daily. He reports significant improvement in mood stability and emotional regulation, with positive feedback from family. - Continue Zoloft  50 mg daily      Attention deficit hyperactivity disorder (ADHD), predominantly inattentive type - Primary   Attention-deficit hyperactivity disorder ADHD symptoms are not adequately controlled on current Adderall 10 mg twice daily. He reports decreased focus and energy, particularly in the afternoon, leading to reliance on caffeine. Prefers twice daily dosing over extended release due to personal routine and mental comfort. Decision made to increase dosage to improve symptom control throughout the day. - Increase Adderall to 20 mg twice daily - [facility]      Relevant Medications   amphetamine -dextroamphetamine  (ADDERALL) 20 MG tablet     Assessment & Plan       Return in about 1 month (around 03/16/2024) for ADHD.          Mimi Alt, MD  [facility] [phone] (phone) [phone] (fax)  [facility]

## 2024-02-15 NOTE — Assessment & Plan Note (Signed)
 Attention-deficit hyperactivity disorder ADHD symptoms are not adequately controlled on current Adderall 10 mg twice daily. He reports decreased focus and energy, particularly in the afternoon, leading to reliance on caffeine. Prefers twice daily dosing over extended release due to personal routine and mental comfort. Decision made to increase dosage to improve symptom control throughout the day. - Increase Adderall to 20 mg twice daily - [facility]

## 2024-03-23 ENCOUNTER — Ambulatory Visit: Admitting: Family Medicine

## 2024-04-06 ENCOUNTER — Other Ambulatory Visit: Payer: Self-pay | Admitting: Family Medicine

## 2024-04-06 DIAGNOSIS — I10 Essential (primary) hypertension: Secondary | ICD-10-CM

## 2024-04-06 DIAGNOSIS — F900 Attention-deficit hyperactivity disorder, predominantly inattentive type: Secondary | ICD-10-CM

## 2024-04-06 NOTE — Telephone Encounter (Unsigned)
 Copied from CRM [id number]. Topic: Clinical - Medication Refill >> Apr 06, 2024  3:22 PM DeAngela L wrote: Medication: amphetamine -dextroamphetamine  (ADDERALL) 20 MG tablet hydrochlorothiazide  (HYDRODIURIL ) 25 MG tablet  Has the patient contacted their pharmacy? Yes  (Agent: If no, request that the patient contact the pharmacy for the refill. If patient does not wish to contact the pharmacy document the reason why and proceed with request.) (Agent: If yes, when and what did the pharmacy advise?)  This is the patient's preferred pharmacy:  [id number] [place] [place] - [address] [place] [address] Phone: [phone] Fax: [phone]  Is this the correct pharmacy for this prescription? Yes  If no, delete pharmacy and type the correct one.   Has the prescription been filled recently? Yes   Is the patient out of the medication? Yes   Has the patient been seen for an appointment in the last year OR does the patient have an upcoming appointment? Yes   Can we respond through MyChart? Yes   Agent: Please be advised that Rx refills may take up to 3 business days. We ask that you follow-up with your pharmacy.

## 2024-04-10 NOTE — Telephone Encounter (Signed)
 Requested medication (s) are due for refill today-yes  Requested medication (s) are on the active medication list -yes  Future visit scheduled -yew  Last refill: 02/15/24 #60  Notes to clinic: non delegated Rx  Requested Prescriptions  Pending Prescriptions Disp Refills   amphetamine -dextroamphetamine  (ADDERALL) 20 MG tablet 60 tablet 0    Sig: Take 1 tablet (20 mg total) by mouth 2 (two) times daily.     Not Delegated - Psychiatry:  Stimulants/ADHD Failed - 04/10/2024  1:42 PM      Failed - This refill cannot be delegated      Failed - Urine Drug Screen completed in last 360 days      Passed - Last BP in normal range    BP Readings from Last 1 Encounters:  02/15/24 129/81         Passed - Last Heart Rate in normal range    Pulse Readings from Last 1 Encounters:  02/15/24 87         Passed - Valid encounter within last 6 months    Recent Outpatient Visits           1 month ago Attention deficit hyperactivity disorder (ADHD), predominantly inattentive type   [place] [facility] Simmons-Robinson, [place], MD   3 months ago Moderate episode of recurrent major depressive disorder (HCC)   [place] [facility] Simmons-Robinson, [place], MD   4 months ago Moderate episode of recurrent major depressive disorder (HCC)   [employer] [facility] Simmons-Robinson, [place], MD   4 months ago Primary hypertension   [place] [facility] [place], [place], MD              Refused Prescriptions Disp Refills   hydrochlorothiazide  (HYDRODIURIL ) 25 MG tablet 90 tablet 1    Sig: Take 1 tablet (25 mg total) by mouth daily. Take one tablet by mouth once daily     Cardiovascular: Diuretics - Thiazide Passed - 04/10/2024  1:42 PM      Passed - Cr in normal range and within 180 days    Creatinine, Ser  Date Value Ref Range Status  11/22/2023 1.09 0.76 - 1.27 mg/dL Final         Passed - K in normal range  and within 180 days    Potassium  Date Value Ref Range Status  11/22/2023 4.0 3.5 - 5.2 mmol/L Final         Passed - Na in normal range and within 180 days    Sodium  Date Value Ref Range Status  11/22/2023 143 134 - 144 mmol/L Final         Passed - Last BP in normal range    BP Readings from Last 1 Encounters:  02/15/24 129/81         Passed - Valid encounter within last 6 months    Recent Outpatient Visits           1 month ago Attention deficit hyperactivity disorder (ADHD), predominantly inattentive type   [place] [facility] Simmons-Robinson, [place], MD   3 months ago Moderate episode of recurrent major depressive disorder (HCC)   [place] [facility] Simmons-Robinson, [place], MD   4 months ago Moderate episode of recurrent major depressive disorder (HCC)   [place] [facility] Simmons-Robinson, [place], MD   4 months ago Primary hypertension   [place] [facility] [place], [place], MD  Requested Prescriptions  Pending Prescriptions Disp Refills   amphetamine -dextroamphetamine  (ADDERALL) 20 MG tablet 60 tablet 0    Sig: Take 1 tablet (20 mg total) by mouth 2 (two) times daily.     Not Delegated - Psychiatry:  Stimulants/ADHD Failed - 04/10/2024  1:42 PM      Failed - This refill cannot be delegated      Failed - Urine Drug Screen completed in last 360 days      Passed - Last BP in normal range    BP Readings from Last 1 Encounters:  02/15/24 129/81         Passed - Last Heart Rate in normal range    Pulse Readings from Last 1 Encounters:  02/15/24 87         Passed - Valid encounter within last 6 months    Recent Outpatient Visits           1 month ago Attention deficit hyperactivity disorder (ADHD), predominantly inattentive type   [place] [facility] Simmons-Robinson, [place], MD   3 months ago Moderate episode of recurrent  major depressive disorder (HCC)   [place] [facility] Simmons-Robinson, [place], MD   4 months ago Moderate episode of recurrent major depressive disorder (HCC)   [place] [facility] Simmons-Robinson, [place], MD   4 months ago Primary hypertension   [place] [facility] [place], [place], MD              Refused Prescriptions Disp Refills   hydrochlorothiazide  (HYDRODIURIL ) 25 MG tablet 90 tablet 1    Sig: Take 1 tablet (25 mg total) by mouth daily. Take one tablet by mouth once daily     Cardiovascular: Diuretics - Thiazide Passed - 04/10/2024  1:42 PM      Passed - Cr in normal range and within 180 days    Creatinine, Ser  Date Value Ref Range Status  11/22/2023 1.09 0.76 - 1.27 mg/dL Final         Passed - K in normal range and within 180 days    Potassium  Date Value Ref Range Status  11/22/2023 4.0 3.5 - 5.2 mmol/L Final         Passed - Na in normal range and within 180 days    Sodium  Date Value Ref Range Status  11/22/2023 143 134 - 144 mmol/L Final         Passed - Last BP in normal range    BP Readings from Last 1 Encounters:  02/15/24 129/81         Passed - Valid encounter within last 6 months    Recent Outpatient Visits           1 month ago Attention deficit hyperactivity disorder (ADHD), predominantly inattentive type   [place] [facility] Simmons-Robinson, [place], MD   3 months ago Moderate episode of recurrent major depressive disorder (HCC)   [place] [facility] Simmons-Robinson, [place], MD   4 months ago Moderate episode of recurrent major depressive disorder (HCC)   [facility] Simmons-Robinson, [place], MD   4 months ago Primary hypertension    [facility] [place], [place], MD

## 2024-04-10 NOTE — Telephone Encounter (Signed)
 Rx 11/08/23 #90 1RF Requested Prescriptions  Pending Prescriptions Disp Refills   amphetamine -dextroamphetamine  (ADDERALL) 20 MG tablet 60 tablet 0    Sig: Take 1 tablet (20 mg total) by mouth 2 (two) times daily.     Not Delegated - Psychiatry:  Stimulants/ADHD Failed - 04/10/2024  1:42 PM      Failed - This refill cannot be delegated      Failed - Urine Drug Screen completed in last 360 days      Passed - Last BP in normal range    BP Readings from Last 1 Encounters:  02/15/24 129/81         Passed - Last Heart Rate in normal range    Pulse Readings from Last 1 Encounters:  02/15/24 87         Passed - Valid encounter within last 6 months    Recent Outpatient Visits           1 month ago Attention deficit hyperactivity disorder (ADHD), predominantly inattentive type   [place] [facility] Simmons-Robinson, [place], MD   3 months ago Moderate episode of recurrent major depressive disorder (HCC)   [place] [facility] Simmons-Robinson, [place], MD   4 months ago Moderate episode of recurrent major depressive disorder (HCC)   [place] [facility] Simmons-Robinson, [place], MD   4 months ago Primary hypertension   [place] [facility] Simmons-Robinson, [place], MD               hydrochlorothiazide  (HYDRODIURIL ) 25 MG tablet 90 tablet 1    Sig: Take 1 tablet (25 mg total) by mouth daily. Take one tablet by mouth once daily     Cardiovascular: Diuretics - Thiazide Passed - 04/10/2024  1:42 PM      Passed - Cr in normal range and within 180 days    Creatinine, Ser  Date Value Ref Range Status  11/22/2023 1.09 0.76 - 1.27 mg/dL Final         Passed - K in normal range and within 180 days    Potassium  Date Value Ref Range Status  11/22/2023 4.0 3.5 - 5.2 mmol/L Final         Passed - Na in normal range and within 180 days    Sodium  Date Value Ref Range Status  11/22/2023 143 134 - 144 mmol/L  Final         Passed - Last BP in normal range    BP Readings from Last 1 Encounters:  02/15/24 129/81         Passed - Valid encounter within last 6 months    Recent Outpatient Visits           1 month ago Attention deficit hyperactivity disorder (ADHD), predominantly inattentive type   [place] [facility] Simmons-Robinson, [place], MD   3 months ago Moderate episode of recurrent major depressive disorder (HCC)   [place] [facility] Simmons-Robinson, [place], MD   4 months ago Moderate episode of recurrent major depressive disorder (HCC)   [place] [facility] Simmons-Robinson, [place], MD   4 months ago Primary hypertension   [place] [facility] [place], [place], MD

## 2024-04-12 MED ORDER — AMPHETAMINE-DEXTROAMPHETAMINE 20 MG PO TABS: 20.0000 mg | ORAL_TABLET | Freq: Two times a day (BID) | ORAL | 0 refills | Status: DC

## 2024-04-12 MED ORDER — HYDROCHLOROTHIAZIDE 25 MG PO TABS: 25.0000 mg | ORAL_TABLET | Freq: Every day | ORAL | 1 refills | Status: AC

## 2024-05-04 ENCOUNTER — Ambulatory Visit: Admitting: Family Medicine

## 2024-05-04 ENCOUNTER — Encounter: Payer: Self-pay | Admitting: Family Medicine

## 2024-05-04 VITALS — BP 132/83 | HR 85 | Ht 72.0 in | Wt 240.0 lb

## 2024-05-04 DIAGNOSIS — F331 Major depressive disorder, recurrent, moderate: Secondary | ICD-10-CM

## 2024-05-04 DIAGNOSIS — F900 Attention-deficit hyperactivity disorder, predominantly inattentive type: Secondary | ICD-10-CM

## 2024-05-04 DIAGNOSIS — I10 Essential (primary) hypertension: Secondary | ICD-10-CM | POA: Diagnosis not present

## 2024-05-04 MED ORDER — AMPHETAMINE-DEXTROAMPHETAMINE 20 MG PO TABS: 20.0000 mg | ORAL_TABLET | Freq: Two times a day (BID) | ORAL | 0 refills | Status: DC

## 2024-05-04 MED ORDER — SERTRALINE HCL 100 MG PO TABS: 100.0000 mg | ORAL_TABLET | Freq: Every day | ORAL | 3 refills | Status: AC

## 2024-05-04 MED ORDER — AMPHETAMINE-DEXTROAMPHETAMINE 20 MG PO TABS: 20.0000 mg | ORAL_TABLET | Freq: Two times a day (BID) | ORAL | 0 refills | Status: AC

## 2024-05-04 NOTE — Assessment & Plan Note (Signed)
 Chronic hypertension managed with amlodipine  10 mg daily and hydrochlorothiazide  25 mg daily. Blood pressure is well-controlled on the current regimen. - Continue amlodipine  10 mg daily - Continue hydrochlorothiazide  25 mg daily

## 2024-05-04 NOTE — Assessment & Plan Note (Signed)
 Chronic ADHD managed with Adderall 20 mg daily. He reports effective focus and task completion with the current dose. - Continue Adderall 20 mg daily - Send prescription for Adderall with enough refills for the next three months

## 2024-05-04 NOTE — Patient Instructions (Signed)
   If you are feeling suicidal or depression symptoms worsen please immediately go to:   If you are thinking about harming yourself or having thoughts of suicide, or if you know someone who is, seek help right away. If you are in crisis, make sure you are not left alone.  If someone else is in crisis, make sure he/she/they is not left alone  Call 988 OR 1-800-273-TALK  24 Hour Availability for Walk-IN services  [facility]  [address] [place], [place] [phone] Crisis [phone]    Other crisis resources:  [facility] [employer] (Domestic Violence, Rape & Victim Assistance [phone]  [facility] (24/7)   [phone]  [place]  National Suicide Hotline   863 551 7296 MERRILYN)

## 2024-05-04 NOTE — Progress Notes (Signed)
 Established patient visit   Patient: Ryan Morrow   DOB: 09/17/1986   38 y.o. Male  MRN: [id number] Visit Date: 05/04/2024  Today's healthcare provider: Rockie Agent, MD   Chief Complaint  Patient presents with   ADHD    Things has gotten better since the increase of his medication    Subjective     HPI     ADHD    Additional comments: Things has gotten better since the increase of his medication       Last edited by Thelbert Eulalio HERO, CMA on 05/04/2024  8:30 AM.       Discussed the use of AI scribe software for clinical note transcription with the patient, who gave verbal consent to proceed.  History of Present Illness Ryan Morrow is a 38 year old male with ADHD who presents for chronic follow-up of his ADHD.  He is currently taking Adderall 20 mg daily, which he finds effective in helping him focus and complete tasks throughout the workday. His ability to manage daily activities has improved significantly.  He has a history of depression and is taking Zoloft  50 mg daily. His mood is more manageable, and he is more aware of mood changes, which helps him avoid negative interactions. He acknowledges persistent underlying irritability but notes an increase in compassion and empathy towards others. He experiences chronic suicidal ideation, describing a longing for 'stillness and quiet,' but denies any current plan or intention to harm himself. His children are a significant protective factor. He is concerned about the potential impact of seeking therapy on his ability to own firearms, which he enjoys using for recreational shooting with his children.  He is being treated for hypertension with amlodipine  10 mg daily and hydrochlorothiazide  25 mg daily. He also has a history of obesity, which is relevant to his current health status.  In the review of symptoms, he reports high scores for both depression and anxiety on the PHQ-9 and generalized anxiety scale.  He is more aware of his mood changes and actively tries to manage his irritability to avoid negatively impacting those around him, particularly his children.     Past Medical History:  Diagnosis Date   ADHD    Anxiety    Depression    Hypertension     Medications: Outpatient Medications Prior to Visit  Medication Sig   amLODipine  (NORVASC ) 10 MG tablet Take 1 tablet (10 mg total) by mouth daily.   hydrochlorothiazide  (HYDRODIURIL ) 25 MG tablet Take 1 tablet (25 mg total) by mouth daily. Take one tablet by mouth once daily   ibuprofen  (ADVIL ) 800 MG tablet Take by mouth. (Patient not taking: Reported on 05/04/2024)   [DISCONTINUED] amphetamine -dextroamphetamine  (ADDERALL) 20 MG tablet Take 1 tablet (20 mg total) by mouth 2 (two) times daily.   [DISCONTINUED] sertraline  (ZOLOFT ) 50 MG tablet Take 1 tablet (50 mg total) by mouth daily.   No facility-administered medications prior to visit.   Flowsheet Row Office Visit from 05/04/2024 in [facility]  PHQ-9 Total Score 13      05/04/2024    9:32 AM 02/15/2024   11:07 AM  GAD 7 : Generalized Anxiety Score  Nervous, Anxious, on Edge 2 1  Control/stop worrying 0 0  Worry too much - different things 0 0  Trouble relaxing 2 1  Restless 2 2  Easily annoyed or irritable 2 3  Afraid - awful might happen 2 1  Total GAD 7 Score 10  8  Anxiety Difficulty Somewhat difficult Somewhat difficult     Review of Systems      Objective    BP 132/83   Pulse 85   Ht 6' (1.829 m)   Wt 240 lb (108.9 kg)   SpO2 99%   BMI 32.55 kg/m      Physical Exam Vitals reviewed.  Constitutional:      General: He is not in acute distress.    Appearance: Normal appearance. He is not ill-appearing.  Cardiovascular:     Rate and Rhythm: Normal rate and regular rhythm.  Pulmonary:     Effort: Pulmonary effort is normal. No respiratory distress.     Breath sounds: No wheezing, rhonchi or rales.  Neurological:     Mental  Status: He is alert and oriented to person, place, and time.  Psychiatric:        Attention and Perception: Attention normal.        Mood and Affect: Mood and affect normal. Mood is not anxious or depressed. Affect is not tearful.        Speech: Speech normal.        Behavior: Behavior normal.        Thought Content: Thought content includes suicidal ideation. Thought content does not include homicidal ideation. Thought content does not include homicidal or suicidal plan.       No results found for any visits on 05/04/24.  Assessment & Plan     Problem List Items Addressed This Visit       Cardiovascular and Mediastinum   Primary hypertension   Chronic hypertension managed with amlodipine  10 mg daily and hydrochlorothiazide  25 mg daily. Blood pressure is well-controlled on the current regimen. - Continue amlodipine  10 mg daily - Continue hydrochlorothiazide  25 mg daily        Other   Moderate episode of recurrent major depressive disorder (HCC)   Depression with Suicidal Ideation Chronic depression with elevated PHQ-9 and generalized anxiety scale scores. He reports ongoing suicidal ideation without a current plan or intention to harm himself. He has a history of attempts and is a gun owner, but protective factors include his children. Currently on Zoloft  50 mg daily, which partially manages his mood. He expresses concern about therapy due to fear of losing firearms but acknowledges its potential benefits. - Increase Zoloft  to 100 mg daily - Submit referral for psychiatry - Provide crisis line resources for suicide prevention - Follow up in two weeks to monitor mood and ensure establishment with a therapist      Relevant Medications   sertraline  (ZOLOFT ) 100 MG tablet   Other Relevant Orders   Ambulatory referral to Psychiatry   Attention deficit hyperactivity disorder (ADHD), predominantly inattentive type - Primary   Chronic ADHD managed with Adderall 20 mg daily. He  reports effective focus and task completion with the current dose. - Continue Adderall 20 mg daily - Send prescription for Adderall with enough refills for the next three months      Relevant Medications   amphetamine -dextroamphetamine  (ADDERALL) 20 MG tablet   amphetamine -dextroamphetamine  (ADDERALL) 20 MG tablet (Start on 06/02/2024)   amphetamine -dextroamphetamine  (ADDERALL) 20 MG tablet (Start on 07/04/2024)   Other Relevant Orders   Ambulatory referral to Psychiatry     Assessment & Plan    General Health Maintenance Plan for physical examination in October, including fasting for cholesterol levels. - Schedule physical examination in October - Advise fasting after midnight before the examination for cholesterol testing  Return in about 2 weeks (around 05/18/2024) for Depression.         Rockie Agent, MD  [facility] [phone] (phone) [phone] (fax)  [facility]

## 2024-05-04 NOTE — Assessment & Plan Note (Signed)
 Depression with Suicidal Ideation Chronic depression with elevated PHQ-9 and generalized anxiety scale scores. He reports ongoing suicidal ideation without a current plan or intention to harm himself. He has a history of attempts and is a gun owner, but protective factors include his children. Currently on Zoloft  50 mg daily, which partially manages his mood. He expresses concern about therapy due to fear of losing firearms but acknowledges its potential benefits. - Increase Zoloft  to 100 mg daily - Submit referral for psychiatry - Provide crisis line resources for suicide prevention - Follow up in two weeks to monitor mood and ensure establishment with a therapist

## 2024-06-14 ENCOUNTER — Telehealth: Payer: Self-pay | Admitting: Family Medicine

## 2024-06-14 NOTE — Telephone Encounter (Signed)
 Agree with a visit to evaluate this new skin issue and decide next steps

## 2024-06-14 NOTE — Telephone Encounter (Signed)
 Pt came in the office and is asking for a cream to be refilled that he was prescribed at [facility] urgent care on 11/04/2022.  He states that it helped him a lot.  It is called clotrimazole-betamethasone (LOTRISONE) 1-0.05 % cream.  [facility] uses [facility] [place].  I advised pt that he would probably need an appointment for this since Dr. Lang has not seen him for this.  He stated that it is just a cream and wanted me to ask if it would be able to be sent in without an appointment.  Please advise

## 2024-06-20 NOTE — Progress Notes (Unsigned)
      Established patient visit   Patient: Ryan Morrow   DOB: Jul 21, 1986   38 y.o. Male  MRN: [id number] Visit Date: 06/21/2024  Today's healthcare provider: Isaiah DELENA Pepper, MD   No chief complaint on file.  Subjective    HPI  Hx of ?fungal infection - treated with doxycyline and lotrisone in January 2024   Medications: Outpatient Medications Prior to Visit  Medication Sig   amLODipine  (NORVASC ) 10 MG tablet Take 1 tablet (10 mg total) by mouth daily.   amphetamine -dextroamphetamine  (ADDERALL) 20 MG tablet Take 1 tablet (20 mg total) by mouth 2 (two) times daily.   amphetamine -dextroamphetamine  (ADDERALL) 20 MG tablet Take 1 tablet (20 mg total) by mouth 2 (two) times daily.   [START ON 07/04/2024] amphetamine -dextroamphetamine  (ADDERALL) 20 MG tablet Take 1 tablet (20 mg total) by mouth 2 (two) times daily.   hydrochlorothiazide  (HYDRODIURIL ) 25 MG tablet Take 1 tablet (25 mg total) by mouth daily. Take one tablet by mouth once daily   ibuprofen  (ADVIL ) 800 MG tablet Take by mouth. (Patient not taking: Reported on 05/04/2024)   sertraline  (ZOLOFT ) 100 MG tablet Take 1 tablet (100 mg total) by mouth daily.   No facility-administered medications prior to visit.    Review of Systems as noted in HPI.  {Insert previous labs (optional):23779} {See past labs  Heme  Chem  Endocrine  Serology  Results Review (optional):1}   Objective    There were no vitals taken for this visit. {Insert last BP/Wt (optional):23777}{See vitals history (optional):1}  Physical Exam   No results found for any visits on 06/21/24.  Assessment & Plan     Problem List Items Addressed This Visit   None    No follow-ups on file.       Isaiah DELENA Pepper, MD  [facility] [phone] (phone) [phone] (fax)

## 2024-06-21 ENCOUNTER — Ambulatory Visit

## 2024-06-21 VITALS — BP 129/87 | HR 65 | Resp 16 | Ht 72.0 in | Wt 234.0 lb

## 2024-06-21 DIAGNOSIS — L219 Seborrheic dermatitis, unspecified: Secondary | ICD-10-CM | POA: Insufficient documentation

## 2024-06-21 DIAGNOSIS — L918 Other hypertrophic disorders of the skin: Secondary | ICD-10-CM | POA: Insufficient documentation

## 2024-06-21 MED ORDER — KETOCONAZOLE 2 % EX SHAM: 1.0000 | MEDICATED_SHAMPOO | CUTANEOUS | 3 refills | Status: AC

## 2024-06-21 NOTE — Assessment & Plan Note (Addendum)
~  2cm skin tag present under L armpit. Has been there for >10 years. Interested in removal for cosmetic reasons. Will schedule procedure appt in 1 month. Discussed details of procedure with patient.

## 2024-06-21 NOTE — Assessment & Plan Note (Addendum)
 Chronic, uncontrolled. Typically present at scalp, neck, and beard. Has white flaky plaque present on L chin at beard today. Previously prescribed clotrimazole-betamethasone cream which helped but led to skin discoloration. Will prescribe ketoconazole  shampoo. Discussed appropriate use with patient.

## 2024-07-07 ENCOUNTER — Other Ambulatory Visit: Payer: Self-pay | Admitting: Family Medicine

## 2024-07-07 DIAGNOSIS — I10 Essential (primary) hypertension: Secondary | ICD-10-CM

## 2024-07-19 ENCOUNTER — Ambulatory Visit

## 2024-07-19 VITALS — BP 130/87 | HR 71 | Resp 16 | Ht 72.0 in | Wt 235.0 lb

## 2024-07-19 DIAGNOSIS — L219 Seborrheic dermatitis, unspecified: Secondary | ICD-10-CM

## 2024-07-19 DIAGNOSIS — L918 Other hypertrophic disorders of the skin: Secondary | ICD-10-CM | POA: Diagnosis not present

## 2024-07-19 MED ORDER — CLOTRIMAZOLE-BETAMETHASONE 1-0.05 % EX CREA: 1.0000 | TOPICAL_CREAM | Freq: Every day | CUTANEOUS | 0 refills | Status: AC

## 2024-07-19 MED ORDER — LIDOCAINE-EPINEPHRINE (PF) 1 %-1:200000 IJ SOLN
1.0000 mL | Freq: Once | INTRAMUSCULAR | Status: AC
  Administered 2024-07-19: 1 mL via INTRADERMAL

## 2024-07-19 NOTE — Progress Notes (Signed)
 Skin excision  Date/Time: 07/19/2024 9:04 AM  Performed by: Franchot Isaiah LABOR, MD Authorized by: Franchot Isaiah LABOR, MD   Number of Lesions: 1 Lesion 1:    Body area: trunk   Trunk location: L axilla   Initial size (mm): 10   Final defect size (mm): 10   Malignancy: benign lesion     Destruction method: scissors used for extraction    Comments:  Skin tag removal. Consent obtained. Cleaned area with chlorhexidine. Injected 1cc of lidocaine with epinephrine at lesion to create a wheal. Used scissors for excision. Applied silver nitrate to wound. Applied bandage. Wound care instructions and return precautions given.

## 2024-07-19 NOTE — Patient Instructions (Signed)
 Skin Tag Removal Instructions   **What is a skin tag?**      A skin tag is a small, soft, benign growth that often appears on the neck, armpits, or groin. Skin tags are harmless but can be removed if they become bothersome or for cosmetic reasons.      **How are skin tags removed?**      - **Scissor Excision:** The most effective and preferred method is removal with sterile scissors. This is quick, has a high healing rate, and is generally well tolerated. Local anesthesia or a cooling spray (such as ethyl chloride) may be used to minimize discomfort.[1][2]      - **Laser Therapy:** Lasers can also remove skin tags, but healing rates are lower and there may be more redness or changes in skin color after the procedure. Most patients prefer scissor excision.[1]      - **Topical Agents:** Over-the-counter products containing salicylic acid are sometimes used, but these are not recommended for open wounds or infected skin and are less effective for skin tags compared to excision.[3]      **What to expect after removal:**      - Mild discomfort, redness, or swelling may occur for a few days.      - Bleeding is usually minimal and stops quickly.      - Healing typically occurs within 1-2 weeks.      **How to care for the wound:**      1. **Keep the area clean:** Gently wash with mild soap and water daily.      2. **Apply a protective ointment:** Use plain petroleum jelly or a skin protectant ointment (such as Aquaphor) twice daily to keep the wound moist and promote healing. Topical antibiotics are not necessary for most minor wounds and do not improve healing compared to protectant ointments.[4][5]      3. **Cover with a non-stick dressing:** If the area is at risk of friction or contamination, cover with a non-stick (Telfa-type) dressing. Change the dressing daily or if it becomes wet or dirty.[6]      4. **Avoid antiseptics:** Do not use antiseptics like iodine or chlorhexidine on open  wounds, as these can slow healing.[6]      5. **Monitor for signs of infection:** Watch for increased redness, swelling, pus, or pain. If these occur, contact your healthcare provider.      **Activity and follow-up:**      - Avoid activities that may stretch or traumatize the area for at least 1 week.      - Most wounds heal without complications. If stitches were used, follow instructions for removal.       **When to seek help:**      - If you notice increasing pain, redness, swelling, pus, or fever.      - If bleeding does not stop after applying gentle pressure for several minutes.

## 2024-07-19 NOTE — Progress Notes (Signed)
      Established patient visit  Patient: Ryan Morrow   DOB: Nov 14, 1985   38 y.o. Male  MRN: [id number] Visit Date: 07/19/2024  Today's healthcare provider: Isaiah DELENA Pepper, MD   Chief Complaint  Patient presents with   Follow-up    4 week f/u for Skin tag removal    Subjective    HPI  HPI:  - Here for skin tag removal  - Ketoconazole  has been helping seb derm on head but not on neck/beard. Lotrisone cream worked well in the past.   Medications: Outpatient Medications Prior to Visit  Medication Sig   amLODipine  (NORVASC ) 10 MG tablet TAKE 1 TABLET BY MOUTH EVERY DAY   amphetamine -dextroamphetamine  (ADDERALL) 20 MG tablet Take 1 tablet (20 mg total) by mouth 2 (two) times daily.   amphetamine -dextroamphetamine  (ADDERALL) 20 MG tablet Take 1 tablet (20 mg total) by mouth 2 (two) times daily.   amphetamine -dextroamphetamine  (ADDERALL) 20 MG tablet Take 1 tablet (20 mg total) by mouth 2 (two) times daily.   hydrochlorothiazide  (HYDRODIURIL ) 25 MG tablet Take 1 tablet (25 mg total) by mouth daily. Take one tablet by mouth once daily   ketoconazole  (NIZORAL ) 2 % shampoo Apply 1 Application topically 2 (two) times a week.   sertraline  (ZOLOFT ) 100 MG tablet Take 1 tablet (100 mg total) by mouth daily.   No facility-administered medications prior to visit.    Review of Systems as noted in HPI.      Objective    BP 130/87 (BP Location: Left Arm, Patient Position: Sitting, Cuff Size: Large)   Pulse 71   Resp 16   Ht 6' (1.829 m)   Wt 235 lb (106.6 kg)   SpO2 100%   BMI 31.87 kg/m    Physical Exam Constitutional:      Appearance: Normal appearance.  HENT:     Head: Normocephalic and atraumatic.     Mouth/Throat:     Mouth: Mucous membranes are moist.  Eyes:     Pupils: Pupils are equal, round, and reactive to light.  Pulmonary:     Effort: Pulmonary effort is normal.  Skin:    General: Skin is warm.     Comments: ~1cm skin tag noted inferior to L  axilla  Multiple white patches with scale located on beard and neck    Neurological:     General: No focal deficit present.     Mental Status: He is alert.      No results found for any visits on 07/19/24.  Assessment & Plan     Problem List Items Addressed This Visit       Musculoskeletal and Integument   Seborrheic dermatitis - Primary   Chronic, uncontrolled. Scalp lesions have improved with use of ketoconazole  but lesions on neck and beard has not improved. Has white flaky plaques present on beard and neck today, consistent with seborrheic dermatitis. Lotrisone cream worked well in the past. - Will prescribe lotrisone cream for neck/beard lesions - Continue ketoconazole  shampoo on scalp - Discussed potential risk of skin discoloration. Discussed appropriate use with patient.      Relevant Medications   clotrimazole-betamethasone (LOTRISONE) cream   Skin tag   See procedure note.      Relevant Orders   Skin excision    No follow-ups on file.       Isaiah DELENA Pepper, MD  [facility] [phone] (phone) [phone] (fax)

## 2024-07-19 NOTE — Assessment & Plan Note (Signed)
 See procedure note.

## 2024-07-19 NOTE — Assessment & Plan Note (Addendum)
 Chronic, uncontrolled. Scalp lesions have improved with use of ketoconazole  but lesions on neck and beard has not improved. Has white flaky plaques present on beard and neck today, consistent with seborrheic dermatitis. Lotrisone cream worked well in the past. - Will prescribe lotrisone cream for neck/beard lesions - Continue ketoconazole  shampoo on scalp - Discussed potential risk of skin discoloration. Discussed appropriate use with patient.

## 2024-07-24 NOTE — Telephone Encounter (Unsigned)
 Copied from CRM [id number]. Topic: Clinical - Medication Refill >> Jul 24, 2024 12:33 PM Teressa P wrote: Medication: Adderall 20 mg 2 times a day  Has the patient contacted their pharmacy? Yes ( This is the patient's preferred pharmacy:  [id number] [place] [place] - [address] [place] [address] Phone: [phone] Fax: [phone]  Is this the correct pharmacy for this prescription? Yes If no, delete pharmacy and type the correct one.   Has the prescription been filled recently? Yes  Is the patient out of the medication? No  Has the patient been seen for an appointment in the last year OR does the patient have an upcoming appointment? Yes  Can we respond through MyChart? No  Agent: Please be advised that Rx refills may take up to 3 business days. We ask that you follow-up with your pharmacy.

## 2024-07-31 ENCOUNTER — Telehealth: Payer: Self-pay | Admitting: Family Medicine

## 2024-07-31 ENCOUNTER — Other Ambulatory Visit: Payer: Self-pay | Admitting: Family Medicine

## 2024-07-31 DIAGNOSIS — F900 Attention-deficit hyperactivity disorder, predominantly inattentive type: Secondary | ICD-10-CM

## 2024-07-31 NOTE — Telephone Encounter (Unsigned)
 Copied from CRM [id number]. Topic: Clinical - Medication Refill >> Jul 31, 2024  9:03 AM Kendralyn S wrote: Medication: amphetamine -dextroamphetamine  (ADDERALL) 20 MG tablet  Has the patient contacted their pharmacy? Yes (Agent: If no, request that the patient contact the pharmacy for the refill. If patient does not wish to contact the pharmacy document the reason why and proceed with request.) (Agent: If yes, when and what did the pharmacy advise?)  This is the patient's preferred pharmacy:  [id number] [place], [place] - [address] [place] [address] Phone: [phone] Fax: [phone]  Is this the correct pharmacy for this prescription? Yes If no, delete pharmacy and type the correct one.   Has the prescription been filled recently? No  Is the patient out of the medication? Yes  Has the patient been seen for an appointment in the last year OR does the patient have an upcoming appointment? Yes  Can we respond through MyChart? Yes  Agent: Please be advised that Rx refills may take up to 3 business days. We ask that you follow-up with your pharmacy.

## 2024-07-31 NOTE — Telephone Encounter (Signed)
 Copied from CRM [id number]. Topic: Clinical - Medication Refill >> Jul 31, 2024  9:03 AM Kendralyn S wrote: Medication: amphetamine -dextroamphetamine  (ADDERALL) 20 MG tablet  Has the patient contacted their pharmacy? Yes (Agent: If no, request that the patient contact the pharmacy for the refill. If patient does not wish to contact the pharmacy document the reason why and proceed with request.) (Agent: If yes, when and what did the pharmacy advise?)  This is the patient's preferred pharmacy:  [id number] [place], [place] - [address] [place] [address] Phone: [phone] Fax: [phone]  Is this the correct pharmacy for this prescription? Yes If no, delete pharmacy and type the correct one.   Has the prescription been filled recently? No  Is the patient out of the medication? Yes  Has the patient been seen for an appointment in the last year OR does the patient have an upcoming appointment? Yes  Can we respond through MyChart? Yes  Agent: Please be advised that Rx refills may take up to 3 business days. We ask that you follow-up with your pharmacy.

## 2024-08-01 NOTE — Telephone Encounter (Signed)
 Requested medications are due for refill today.  yes  Requested medications are on the active medications list.  yes  Last refill. 07/04/2024 #60 0 rf  Future visit scheduled.   no  Notes to clinic.  Refill not delegated.    Requested Prescriptions  Pending Prescriptions Disp Refills   amphetamine -dextroamphetamine  (ADDERALL) 20 MG tablet 60 tablet 0    Sig: Take 1 tablet (20 mg total) by mouth 2 (two) times daily.     Not Delegated - Psychiatry:  Stimulants/ADHD Failed - 08/01/2024  4:23 PM      Failed - This refill cannot be delegated      Failed - Urine Drug Screen completed in last 360 days      Passed - Last BP in normal range    BP Readings from Last 1 Encounters:  07/19/24 130/87         Passed - Last Heart Rate in normal range    Pulse Readings from Last 1 Encounters:  07/19/24 71         Passed - Valid encounter within last 6 months    Recent Outpatient Visits           1 week ago Seborrheic dermatitis   [place] [facility] Franchot Isaiah LABOR, MD   1 month ago Seborrheic dermatitis   [place] [facility] Franchot Isaiah LABOR, MD   2 months ago Attention deficit hyperactivity disorder (ADHD), predominantly inattentive type   [place] [facility] [place] Simmons-Robinson, [place], MD   5 months ago Attention deficit hyperactivity disorder (ADHD), predominantly inattentive type   [place] [facility] Simmons-Robinson, [place], MD   7 months ago Moderate episode of recurrent major depressive disorder [facility])   [place] [facility] Simmons-Robinson, [place], MD

## 2024-08-02 MED ORDER — AMPHETAMINE-DEXTROAMPHETAMINE 20 MG PO TABS: 20.0000 mg | ORAL_TABLET | Freq: Two times a day (BID) | ORAL | 0 refills | Status: AC

## 2024-09-07 ENCOUNTER — Other Ambulatory Visit: Payer: Self-pay | Admitting: Family Medicine

## 2024-09-07 DIAGNOSIS — F900 Attention-deficit hyperactivity disorder, predominantly inattentive type: Secondary | ICD-10-CM

## 2024-09-07 NOTE — Telephone Encounter (Signed)
 Copied from CRM #[id number]. Topic: Clinical - Medication Refill >> Sep 07, 2024  8:14 AM Fonda T wrote: Medication: amphetamine -dextroamphetamine  (ADDERALL) 20 MG tablet  Has the patient contacted their pharmacy? Yes, advised to contact office   This is the patient's preferred pharmacy:  [id number] - [place], [place] - [address] [place] [address] Phone: [phone] Fax: [phone]   Is this the correct pharmacy for this prescription? Yes If no, delete pharmacy and type the correct one.   Has the prescription been filled recently? Yes  Is the patient out of the medication? No, has about 1 week left  Has the patient been seen for an appointment in the last year OR does the patient have an upcoming appointment? Yes  Can we respond through MyChart? No, pt prefers a call to [phone]  Agent: Please be advised that Rx refills may take up to 3 business days. We ask that you follow-up with your pharmacy.

## 2024-09-21 ENCOUNTER — Emergency Department

## 2024-09-21 ENCOUNTER — Other Ambulatory Visit: Payer: Self-pay

## 2024-09-21 ENCOUNTER — Emergency Department: Admission: EM | Admit: 2024-09-21 | Discharge: 2024-09-21 | Disposition: A | Discharge status: Home / Self Care

## 2024-09-21 DIAGNOSIS — R079 Chest pain, unspecified: Secondary | ICD-10-CM

## 2024-09-21 DIAGNOSIS — Z79899 Other long term (current) drug therapy: Secondary | ICD-10-CM | POA: Diagnosis not present

## 2024-09-21 DIAGNOSIS — R059 Cough, unspecified: Secondary | ICD-10-CM | POA: Diagnosis not present

## 2024-09-21 DIAGNOSIS — I10 Essential (primary) hypertension: Secondary | ICD-10-CM | POA: Diagnosis not present

## 2024-09-21 DIAGNOSIS — R0602 Shortness of breath: Secondary | ICD-10-CM | POA: Diagnosis not present

## 2024-09-21 DIAGNOSIS — J069 Acute upper respiratory infection, unspecified: Secondary | ICD-10-CM | POA: Insufficient documentation

## 2024-09-21 DIAGNOSIS — R091 Pleurisy: Secondary | ICD-10-CM | POA: Diagnosis not present

## 2024-09-21 DIAGNOSIS — R0789 Other chest pain: Secondary | ICD-10-CM | POA: Diagnosis not present

## 2024-09-21 LAB — RESP PANEL BY RT-PCR (RSV, FLU A&B, COVID)  RVPGX2
Influenza A by PCR: NEGATIVE
Influenza B by PCR: NEGATIVE
Resp Syncytial Virus by PCR: NEGATIVE
SARS Coronavirus 2 by RT PCR: NEGATIVE

## 2024-09-21 LAB — PRO BRAIN NATRIURETIC PEPTIDE: Pro Brain Natriuretic Peptide: <50 pg/mL (ref <300.0)

## 2024-09-21 LAB — CBC
HCT: 42.5 % (ref 39.0–52.0)
Hemoglobin: 13.7 g/dL (ref 13.0–17.0)
MCH: 27.1 pg (ref 26.0–34.0)
MCHC: 32.2 g/dL (ref 30.0–36.0)
MCV: 84 fL (ref 80.0–100.0)
Platelets: 318 K/uL (ref 150–400)
RBC: 5.06 MIL/uL (ref 4.22–5.81)
RDW: 15.1 % (ref 11.5–15.5)
WBC: 11.7 K/uL — ABNORMAL HIGH (ref 4.0–10.5)
nRBC: 0 % (ref 0.0–0.2)

## 2024-09-21 LAB — BASIC METABOLIC PANEL WITH GFR
Anion gap: 13 (ref 5–15)
BUN: 15 mg/dL (ref 6–20)
CO2: 26 mmol/L (ref 22–32)
Calcium: 9.7 mg/dL (ref 8.9–10.3)
Chloride: 99 mmol/L (ref 98–111)
Creatinine, Ser: 1.03 mg/dL (ref 0.61–1.24)
GFR, Estimated: >60 mL/min (ref >60)
Glucose, Bld: 84 mg/dL (ref 70–99)
Potassium: 3.8 mmol/L (ref 3.5–5.1)
Sodium: 137 mmol/L (ref 135–145)

## 2024-09-21 LAB — GROUP A STREP BY PCR: Group A Strep by PCR: NOT DETECTED

## 2024-09-21 LAB — TROPONIN T, HIGH SENSITIVITY: Troponin T High Sensitivity: <15 ng/L (ref 0–19)

## 2024-09-21 MED ORDER — SODIUM CHLORIDE 0.9 % IV BOLUS
1000.0000 mL | Freq: Once | INTRAVENOUS | Status: AC
  Administered 2024-09-21: 1000 mL via INTRAVENOUS

## 2024-09-21 MED ORDER — IOHEXOL 350 MG/ML SOLN
75.0000 mL | Freq: Once | INTRAVENOUS | Status: AC | PRN
  Administered 2024-09-21: 75 mL via INTRAVENOUS

## 2024-09-21 MED ORDER — KETOROLAC TROMETHAMINE 15 MG/ML IJ SOLN
15.0000 mg | Freq: Once | INTRAMUSCULAR | Status: AC
  Administered 2024-09-21: 15 mg via INTRAVENOUS
  Filled 2024-09-21: qty 1

## 2024-09-21 MED ORDER — PREDNISONE 20 MG PO TABS
60.0000 mg | ORAL_TABLET | Freq: Once | ORAL | Status: AC
  Administered 2024-09-21: 60 mg via ORAL
  Filled 2024-09-21: qty 3

## 2024-09-21 MED ORDER — PREDNISONE 20 MG PO TABS: 60.0000 mg | ORAL_TABLET | Freq: Every day | ORAL | 0 refills | Status: AC

## 2024-09-21 NOTE — ED Triage Notes (Signed)
 Pt reports sore throat headache cough and congestion for the past few days.

## 2024-09-21 NOTE — ED Provider Notes (Signed)
 [facility] Provider Note    Event Date/Time   First MD Initiated Contact with Patient 09/21/24 0036     (approximate)   History   Cough   HPI  Ryan Morrow is a 38 y.o. male who presents today with concern of cough shortness of breath and feeling unwell.  Apparently daughter with similar symptoms a few days prior, she took some Tylenol  and her symptoms improved.  Unfortunately over the last few days he has been developing worsening symptoms, he is also been having some increased shortness of breath with minimal exertion.  He endorses chest pain along the right side of his chest which radiates to his back and gets worse when he takes a deep breath.  Has not had any fevers or chills.  Denies nausea vomiting diarrhea constipation.  No known history of coronary artery disease.  He does have a history of high blood pressure, he takes HCTZ and amlodipine  for this.  He has been having some right sided knee swelling noted a few days ago, but he states that the swelling is now resolved.  No prior history of PEs or DVTs no hemoptysis no hormonal therapy no history of cancer no recent surgeries.     Physical Exam   Triage Vital Signs: ED Triage Vitals  Encounter Vitals Group     BP 09/21/24 0023 (!) 175/103     Girls Systolic BP Percentile --      Girls Diastolic BP Percentile --      Boys Systolic BP Percentile --      Boys Diastolic BP Percentile --      Pulse Rate 09/21/24 0023 89     Resp 09/21/24 0023 20     Temp 09/21/24 0025 98.3 F (36.8 C)     Temp Source 09/21/24 0025 Oral     SpO2 09/21/24 0023 96 %     Weight --      Height --      Head Circumference --      Peak Flow --      Pain Score 09/21/24 0023 7     Pain Loc --      Pain Education --      Exclude from Growth Chart --     Most recent vital signs: Vitals:   09/21/24 0032 09/21/24 0130  BP:  (!) 156/96  Pulse:  89  Resp: (!) 25 15  Temp:    SpO2: 97% 100%     General: Awake, no  distress.  CV:  Good peripheral perfusion.  Resp:  Tachypneic, speaking in 1-2 word sentences Abd:  No distention.  Soft non tender Other:     ED Results / Procedures / Treatments   Labs (all labs ordered are listed, but only abnormal results are displayed) Labs Reviewed  CBC - Abnormal; Notable for the following components:      Result Value   WBC 11.7 (*)    All other components within normal limits  RESP PANEL BY RT-PCR (RSV, FLU A&B, COVID)  [id number]  GROUP A STREP BY PCR  BASIC METABOLIC PANEL WITH GFR  PRO BRAIN NATRIURETIC PEPTIDE  TROPONIN T, HIGH SENSITIVITY     EKG  On my independent interpretation, there appears to be a sinus rhythm with rate of about 85, axis of about 80, intervals appear to be within normal limits, there is are nonspecific T wave inversions noted in leads III and aVF, these appear new when compared to prior EKG  RADIOLOGY No acute findings on chest x-ray  PROCEDURES:  Critical Care performed: No  Procedures   MEDICATIONS ORDERED IN ED: Medications  predniSONE (DELTASONE) tablet 60 mg (has no administration in time range)  sodium chloride  0.9 % bolus 1,000 mL (1,000 mLs Intravenous New Bag/Given 09/21/24 0104)  ketorolac (TORADOL) 15 MG/ML injection 15 mg (15 mg Intravenous Given 09/21/24 0100)  iohexol (OMNIPAQUE) 350 MG/ML injection 75 mL (75 mLs Intravenous Contrast Given 09/21/24 0148)     IMPRESSION / MDM / ASSESSMENT AND PLAN / ED COURSE  I reviewed the triage vital signs and the nursing notes.                               Patient's presentation is most consistent with acute complicated illness / injury requiring diagnostic workup.  38 year old male who presents today with concern of shortness of breath chest pain and cough and sore throat ongoing for the last few days.  Apparently did have some intermittent swelling in his right lower extremity, now with chest pain along the right side radiating to the back.  Appears tachypneic  and very uncomfortable in the exam room, saturation is appropriate on room air at this time.  His EKG demonstrates some new T wave inversions, but his prior EKG was from 2018.  I reviewed his chart he does have family history of early onset coronary artery disease.  But his history today does not appear consistent with ACS, I am more concerned of a possible PE or other cause of his increased shortness of breath.  Given his significant tachypnea may warrant admission, will follow-up labs and imaging and determine further workup accordingly.   Clinical Course as of 09/21/24 0324  Thu Sep 21, 2024  0211 Patient's symptom significantly improved in response to Toradol, CT imaging is also without evidence of a PE, I am awaiting troponin result which is still pending at this time.  He does have those EKG changes noted, given the significant improvement of his symptoms and his ability to ambulate without requiring any increased oxygen and the improvement of his tachypnea, may be reasonable for outpatient cardiology follow-up.  Will discuss with him and consider appropriate safe disposition. [SK]  0300 Patient troponin here is negative.  His symptoms have resolved at this time in response to Toradol.  I suspect this may be pleuritic pain.  I do not suspect today's symptoms were associated with ACS, however he does have the new findings on the EKG.  I discussed with the patient, his father passed away from a heart attack at 1, his uncle also passed away in his late 61s, and many of his cousins have passed away at similar ages as well.  Given this will strongly encourage the patient to follow-up with cardiology, will have him discharged home at this time with a brief course of steroids.  I discussed return precautions, he is agreeable with the plan. [SK]    Clinical Course User Index [SK] Fernand Rossie HERO, MD     FINAL CLINICAL IMPRESSION(S) / ED DIAGNOSES   Final diagnoses:  Upper respiratory tract infection,  unspecified type  Pleurisy  Chest pain, unspecified type     Rx / DC Orders   ED Discharge Orders          Ordered    predniSONE (DELTASONE) 20 MG tablet  Daily with breakfast        09/21/24 0301  Ambulatory referral to Cardiology       Comments: If you have not heard from the Cardiology office within the next 72 hours please call [phone].   09/21/24 0302             Note:  This document was prepared using Dragon voice recognition software and may include unintentional dictation errors.   Fernand Rossie HERO, MD 09/21/24 [phone]

## 2024-09-21 NOTE — ED Notes (Addendum)
 Pt ambulated to room from lobby. Upon initial assessment pt has increased work of breathing, leaning forward to catch his breath. EDP informed of findings. Provider Fernand at bedside

## 2024-09-21 NOTE — Discharge Instructions (Signed)
 You were seen today due to concern of shortness of breath and chest pain.  At this time fortunately your labs and imaging are reassuring.  I have written for a brief course of steroids for you to take at home, please take these as instructed.  I have also placed a referral for the cardiologist, they should contact you within the next 2 to 3 days to schedule an appointment.  If you start having any worsening chest pain, shortness of breath, lightheadedness, or any other symptoms you find concerning please return to the emergency department immediately for further medical management.

## 2024-10-14 ENCOUNTER — Other Ambulatory Visit: Payer: Self-pay | Admitting: Family Medicine

## 2024-10-14 DIAGNOSIS — I10 Essential (primary) hypertension: Secondary | ICD-10-CM

## 2024-10-16 ENCOUNTER — Other Ambulatory Visit: Payer: Self-pay | Admitting: Family Medicine

## 2024-10-16 DIAGNOSIS — F900 Attention-deficit hyperactivity disorder, predominantly inattentive type: Secondary | ICD-10-CM

## 2024-10-16 NOTE — Telephone Encounter (Unsigned)
 Copied from CRM #[id number]. Topic: Clinical - Medication Refill >> Oct 16, 2024  8:46 AM Emylou G wrote: Medication: amphetamine -dextroamphetamine  (ADDERALL) 20 MG tablet  Has the patient contacted their pharmacy? No (Agent: If no, request that the patient contact the pharmacy for the refill. If patient does not wish to contact the pharmacy document the reason why and proceed with request.) (Agent: If yes, when and what did the pharmacy advise?)  This is the patient's preferred pharmacy:  [id number] - [place], [place] - [address] [place] [address] Phone: [phone] Fax: [phone]  Is this the correct pharmacy for this prescription? Yes If no, delete pharmacy and type the correct one.   Has the prescription been filled recently? No  Is the patient out of the medication? Yes  Has the patient been seen for an appointment in the last year OR does the patient have an upcoming appointment? Yes  Can we respond through MyChart? Yes  Agent: Please be advised that Rx refills may take up to 3 business days. We ask that you follow-up with your pharmacy.

## 2024-10-17 NOTE — Telephone Encounter (Signed)
 Requested medication (s) are due for refill today: na   Requested medication (s) are on the active medication list: yes   Last refill:  09/29/24 #60 0 refills  Future visit scheduled: no   Notes to clinic:  not delegated per protocol. Do you want to refill Rx?     Requested Prescriptions  Pending Prescriptions Disp Refills   amphetamine -dextroamphetamine  (ADDERALL) 20 MG tablet 60 tablet 0    Sig: Take 1 tablet (20 mg total) by mouth 2 (two) times daily.     Not Delegated - Psychiatry:  Stimulants/ADHD Failed - 10/17/2024  2:49 PM      Failed - This refill cannot be delegated      Failed - Urine Drug Screen completed in last 360 days      Failed - Last BP in normal range    BP Readings from Last 1 Encounters:  09/21/24 (!) 156/96         Passed - Last Heart Rate in normal range    Pulse Readings from Last 1 Encounters:  09/21/24 89         Passed - Valid encounter within last 6 months    Recent Outpatient Visits           3 months ago Seborrheic dermatitis   [place] [facility] [place] Isaiah LABOR, MD   3 months ago Seborrheic dermatitis   [place] [facility] [place] Isaiah LABOR, MD   5 months ago Attention deficit hyperactivity disorder (ADHD), predominantly inattentive type   [place] [facility] Simmons-Robinson, [place], MD   8 months ago Attention deficit hyperactivity disorder (ADHD), predominantly inattentive type   [facility] [facility] Simmons-Robinson, [place], MD   10 months ago Moderate episode of recurrent major depressive disorder [facility] [place])   [place] [facility] Simmons-Robinson, [place], MD       Future Appointments             In 1 week Argentina Clap, MD [facility]

## 2024-10-19 MED ORDER — AMPHETAMINE-DEXTROAMPHETAMINE 20 MG PO TABS: 20.0000 mg | ORAL_TABLET | Freq: Two times a day (BID) | ORAL | 0 refills | Status: AC

## 2024-10-24 NOTE — Progress Notes (Deleted)
" °  Cardiology Office Note   Date:  10/24/2024  ID:  Ryan Morrow, DOB 1986/10/12, MRN [id number] PCP: Sharma Coyer, MD  [facility] Providers Cardiologist:  None { Click to update primary MD,subspecialty MD or APP then REFRESH:1}    History of Present Illness Ryan Morrow is a 39 y.o. male PMH ADHD on Adderall, HTN who presents for further evaluation and management of chest pain.  Patient was seen in the ED for this issue 09/21/2024.  He reported cough, shortness of breath at that time.  Also had some chest pain on the right side of his chest.  This was reportedly pleuritic.  Pain was treated with Toradol  and improved.  Mild leukocytosis, otherwise had unremarkable labs including troponin, BNP, BMP, CBC.  A CTPA was obtained which was negative for PE.  He was ultimately felt to be low risk so was discharge.  Last LDL was 125 10/2023.  Relevant CVD History -CTPA 09/2024 no CAC and no aortic atherosclerosis   ROS: Pt denies any chest discomfort, jaw pain, arm pain, palpitations, syncope, presyncope, orthopnea, PND, or LE edema.  Studies Reviewed I have independently reviewed the patient's ECG, recent CT scan, recent medical records, recent blood work.  Physical Exam VS:  There were no vitals taken for this visit.       Wt Readings from Last 3 Encounters:  07/19/24 235 lb (106.6 kg)  06/21/24 234 lb (106.1 kg)  05/04/24 240 lb (108.9 kg)    GEN: No acute distress. NECK: No JVD; No carotid bruits. CARDIAC: ***RRR, no murmurs, rubs, gallops. RESPIRATORY:  Clear to auscultation. EXTREMITIES:  Warm and well-perfused. No edema.  ASSESSMENT AND PLAN Chest discomfort HTN HLD        {Are you ordering a CV Procedure (e.g. stress test, cath, DCCV, TEE, etc)?   Press F2        :[id number]}  Dispo: ***  Signed, Caron Poser, MD  "

## 2024-10-25 ENCOUNTER — Ambulatory Visit

## 2024-11-05 NOTE — Progress Notes (Unsigned)
" °  Cardiology Office Note   Date:  11/05/2024  ID:  Ryan Morrow, DOB [birth year], MRN [id number] PCP: Sharma Coyer, MD  [facility] Providers Cardiologist:  None { Click to update primary MD,subspecialty MD or APP then REFRESH:1}    History of Present Illness Ryan Morrow is a 39 y.o. male PMH ADHD on Adderall, HTN who presents for further evaluation and management of chest pain.  Patient was seen in the ED for this issue 09/21/2024.  He reported cough, shortness of breath at that time.  Also had some chest pain on the right side of his chest.  This was reportedly pleuritic.  Pain was treated with Toradol  and improved.  Mild leukocytosis, otherwise had unremarkable labs including troponin, BNP, BMP, CBC.  A CTPA was obtained which was negative for PE.  He was ultimately felt to be low risk so was discharge.  Last LDL was 125 10/2023.  Relevant CVD History -CTPA 09/2024 no CAC and no aortic atherosclerosis   ROS: Pt denies any chest discomfort, jaw pain, arm pain, palpitations, syncope, presyncope, orthopnea, PND, or LE edema.  Studies Reviewed I have independently reviewed the patient's ECG, recent CT scan, recent medical records, recent blood work.  Physical Exam VS:  There were no vitals taken for this visit.       Wt Readings from Last 3 Encounters:  07/19/24 235 lb (106.6 kg)  06/21/24 234 lb (106.1 kg)  05/04/24 240 lb (108.9 kg)    GEN: No acute distress. NECK: No JVD; No carotid bruits. CARDIAC: ***RRR, no murmurs, rubs, gallops. RESPIRATORY:  Clear to auscultation. EXTREMITIES:  Warm and well-perfused. No edema.  ASSESSMENT AND PLAN Chest discomfort HTN HLD        {Are you ordering a CV Procedure (e.g. stress test, cath, DCCV, TEE, etc)?   Press F2        :[id number]}  Dispo: ***  Signed, Caron Poser, MD  "

## 2024-11-06 ENCOUNTER — Ambulatory Visit

## 2024-11-22 ENCOUNTER — Ambulatory Visit: Admitting: Cardiology
